# Patient Record
Sex: Male | Born: 2000 | Race: Black or African American | Hispanic: No | Marital: Single | Smoking: Never smoker
Health system: Southern US, Community
[De-identification: ages and names within clinical notes are randomized; demographics above are authoritative.]

## PROBLEM LIST (undated history)

## (undated) DIAGNOSIS — B009 Herpesviral infection, unspecified: Secondary | ICD-10-CM

## (undated) DIAGNOSIS — F649 Gender identity disorder, unspecified: Secondary | ICD-10-CM

## (undated) DIAGNOSIS — I10 Essential (primary) hypertension: Secondary | ICD-10-CM

## (undated) HISTORY — DX: Herpesviral infection, unspecified: B00.9

---
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
## 2022-01-22 ENCOUNTER — Other Ambulatory Visit (INDEPENDENT_AMBULATORY_CARE_PROVIDER_SITE_OTHER): Payer: Self-pay | Admitting: Pediatric Endocrinology

## 2022-01-22 DIAGNOSIS — E349 Endocrine disorder, unspecified: Secondary | ICD-10-CM

## 2022-01-30 ENCOUNTER — Encounter (INDEPENDENT_AMBULATORY_CARE_PROVIDER_SITE_OTHER): Payer: Self-pay | Admitting: Pediatric Endocrinology

## 2022-02-16 ENCOUNTER — Encounter (INDEPENDENT_AMBULATORY_CARE_PROVIDER_SITE_OTHER): Payer: Self-pay | Admitting: Pediatric Endocrinology

## 2022-03-06 ENCOUNTER — Encounter (INDEPENDENT_AMBULATORY_CARE_PROVIDER_SITE_OTHER): Payer: Self-pay | Admitting: Pediatric Endocrinology

## 2022-03-06 NOTE — Progress Notes (Signed)
03/06/22  Re: JamariDowtinTorain   DOB: 12-01-2000   To Whom it May Concern,  JamariDowtinTorain is a patient in my care at [facility]. Layken has been a patient here since April 2022.  They identify as male and use she/her/hers pronouns.    JamariDowtinTorain notes that she first knew that her gender identity differed from her  assigned sex as a young child. She first disclosed to others at age 21.   Carnel Vaquerano  has socially transitioned by wearing her hair long, using a male name and pronouns, tucking, and dressing as a male.  They have been successfully and consistently living in a gender role congruent with her  affirmed gender since 2019. They have been on gender affirming hormone therapy consistently since 2022.   Despite these interventions, Manford Reta  reports significant anxiety, depression and distress due to her ongoing experience of dysphoria.   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   Randol Rostron has expressed a persistent desire for gender affirming breast augmentation surgery. Their goals for surgical intervention are a more feminine appearance.  Surgery will address her gender dysphoria through helping her appear more feminine and improving the physical manifestation of her internal self image.  Tyron Vanatta is psychologically stable to undergo this surgery.  They are stably housed and she has prepared for her post-op recovery. They have no issues with illicit drug use or abuse  Jacobi Dukes has more than met the [facility] criteria for gender affirming breast augmentation.    The risks, benefits, and alternatives of this surgery have been discussed at her initial surgical consult.  Montarius Liskey is able to make an informed decision about undertaking surgery.   I believe that the next appropriate step for this patient is  for them to undergo gender affirming breast augmentation surgery and I believe this will help her make significant progress in further treating her gender dysphoria.   I am available for coordination of care and ongoing treatment as needed and welcome a phone call to establish this.Therefore, I hereby recommend and refer Darvin Aslin to have this surgery.   If you have questions or concerns, please do not hesitate to contact me.   Sincerely,    Lelon Huh, MD  [phone]

## 2022-03-25 ENCOUNTER — Encounter (INDEPENDENT_AMBULATORY_CARE_PROVIDER_SITE_OTHER): Payer: Self-pay | Admitting: Pediatric Endocrinology

## 2022-04-01 ENCOUNTER — Other Ambulatory Visit (INDEPENDENT_AMBULATORY_CARE_PROVIDER_SITE_OTHER): Payer: Self-pay | Admitting: Pediatric Endocrinology

## 2022-04-01 DIAGNOSIS — I10 Essential (primary) hypertension: Secondary | ICD-10-CM

## 2022-04-04 ENCOUNTER — Encounter: Payer: Medicaid Other | Admitting: Family Medicine

## 2022-04-04 NOTE — Progress Notes (Signed)
Pt came to clinic because they thought we prescribe PREP. Given list of providers in [place].  Please disregard this encounter

## 2022-04-05 ENCOUNTER — Ambulatory Visit: Payer: Medicaid Other | Admitting: Family Medicine

## 2022-04-27 ENCOUNTER — Other Ambulatory Visit (INDEPENDENT_AMBULATORY_CARE_PROVIDER_SITE_OTHER): Payer: Self-pay | Admitting: Pediatric Endocrinology

## 2022-04-27 DIAGNOSIS — E349 Endocrine disorder, unspecified: Secondary | ICD-10-CM

## 2022-04-27 NOTE — H&P (Signed)
Subjective:     Patient ID: Matthew Parrish is a 22 y.o. adult.   HPI   Returns for follow up discussion prior to planned gender affirming surgery. Patient has identified as male since 8 th grade (over 5 years) in all aspects of life. Started Depo, spironolactone therapy managed by Dr. Baldo Ash since 06/2020. Started estrogen treatment 10/2020. Notes breast development over this time.   PMH significant for HTN on lisinopril.   Lives with mother who accompanies her today. Currently working at [employer].   Review of Systems Remainder 12 point review negative    Objective:   Physical Exam Cardiovascular:     Rate and Rhythm: Normal rate and regular rhythm.     Heart sounds: Normal heart sounds.  Pulmonary:     Effort: Pulmonary effort is normal.     Breath sounds: Normal breath sounds.  Lymphadenopathy:     Upper Body:     Right upper body: No axillary adenopathy.     Left upper body: No axillary adenopathy.  Skin:    Comments: Fitzpatrick 6     Chest: no masses no ptosis Soft tissue pinch 6 cm SN to nipple R 20 L 20 cm Midline to nipple R 11.5 L 11. 5 cm BW R 13 L 14 cm Nipple to IMF R 6 L 6 cm      Assessment:     Gender dysphoria    Plan:     Patient has identified and lived as male for over 1 year and completed hormonal therapy for 1 years. She appears to be good candidate for augmentation and good understanding of risks of procedure.    Discussed possible incisions, dual plane vs subglandular vs subfascial positions. Explained by preference for IMF incision and subfascial placement. Discussed saline vs silicone, imaging surveillance of latter, rippling that may be more severe with saline implants, risks rupture, capsular contracture. Discussed post procedure limitations, pain, recovery.  Discussed length of surgery, possible complications of the surgery, and the post surgical limitations and alternatives of surgical approaches to augmentation. She is agreeable to  IMF incisions and subfascial placement. Patient has elected for silicone implants. Plan smooth round. We discussed at her age this is an off label use of silicone implants. Patient re sized today and liked 250 cc. Counseled cannot assure her cup size. Reviewed implants are not permanent devices and additional surgery related to them common. Reviewed the baseline asymmetry breasts, including nipple position, may be more prominent with increased volume.    Additional risks including but not limited to bleeding, seroma, hematoma, infection requiring removal, damage to adjacent structures, unacceptable cosmetic result, need for additional procedures, blood clots in legs or lungs reviewed.    Discussed with patient there is no specific consensus for breast cancer screening for transgender women. Our local radiologists recommend annual or biannual mammography beginning age 52 for transgender women who have taken estrogen treatment for least 5 years.   Completed Herma Carson physician patient checklist.    Rx for tramadol given.

## 2022-05-02 ENCOUNTER — Encounter (HOSPITAL_BASED_OUTPATIENT_CLINIC_OR_DEPARTMENT_OTHER): Payer: Self-pay | Admitting: Plastic Surgery

## 2022-05-02 ENCOUNTER — Other Ambulatory Visit: Payer: Self-pay

## 2022-05-08 ENCOUNTER — Encounter (HOSPITAL_BASED_OUTPATIENT_CLINIC_OR_DEPARTMENT_OTHER)
Admission: RE | Admit: 2022-05-08 | Discharge: 2022-05-08 | Disposition: A | Discharge status: Home / Self Care | Payer: Medicaid Other | Source: Ambulatory Visit | Attending: Plastic Surgery | Admitting: Plastic Surgery

## 2022-05-08 DIAGNOSIS — Z01812 Encounter for preprocedural laboratory examination: Secondary | ICD-10-CM | POA: Diagnosis present

## 2022-05-08 LAB — BASIC METABOLIC PANEL
Anion gap: 6 (ref 5–15)
BUN: 16 mg/dL (ref 6–20)
CO2: 25 mmol/L (ref 22–32)
Calcium: 8.6 mg/dL — ABNORMAL LOW (ref 8.9–10.3)
Chloride: 104 mmol/L (ref 98–111)
Creatinine, Ser: 0.7 mg/dL (ref 0.61–1.24)
GFR, Estimated: >60 mL/min (ref >60)
Glucose, Bld: 93 mg/dL (ref 70–99)
Potassium: 3.6 mmol/L (ref 3.5–5.1)
Sodium: 135 mmol/L (ref 135–145)

## 2022-05-08 MED ORDER — CHLORHEXIDINE GLUCONATE CLOTH 2 % EX PADS: 6.0000 | MEDICATED_PAD | Freq: Once | CUTANEOUS | Status: DC

## 2022-05-08 NOTE — Progress Notes (Signed)

## 2022-05-09 ENCOUNTER — Ambulatory Visit: Payer: Medicaid Other | Admitting: Family Medicine

## 2022-05-09 ENCOUNTER — Encounter (HOSPITAL_BASED_OUTPATIENT_CLINIC_OR_DEPARTMENT_OTHER)
Admission: RE | Disposition: A | Discharge status: Home / Self Care | Payer: Self-pay | Source: Home / Self Care | Attending: Plastic Surgery

## 2022-05-09 ENCOUNTER — Other Ambulatory Visit: Payer: Self-pay

## 2022-05-09 ENCOUNTER — Ambulatory Visit (HOSPITAL_BASED_OUTPATIENT_CLINIC_OR_DEPARTMENT_OTHER): Payer: Medicaid Other | Admitting: Certified Registered Nurse Anesthetist

## 2022-05-09 ENCOUNTER — Encounter (HOSPITAL_BASED_OUTPATIENT_CLINIC_OR_DEPARTMENT_OTHER): Payer: Self-pay | Admitting: Plastic Surgery

## 2022-05-09 ENCOUNTER — Ambulatory Visit (HOSPITAL_BASED_OUTPATIENT_CLINIC_OR_DEPARTMENT_OTHER)
Admission: RE | Admit: 2022-05-09 | Discharge: 2022-05-09 | Disposition: A | Discharge status: Home / Self Care | Payer: Medicaid Other | Attending: Plastic Surgery | Admitting: Plastic Surgery

## 2022-05-09 DIAGNOSIS — F649 Gender identity disorder, unspecified: Secondary | ICD-10-CM | POA: Diagnosis not present

## 2022-05-09 DIAGNOSIS — F640 Transsexualism: Secondary | ICD-10-CM | POA: Insufficient documentation

## 2022-05-09 DIAGNOSIS — Z79899 Other long term (current) drug therapy: Secondary | ICD-10-CM | POA: Insufficient documentation

## 2022-05-09 DIAGNOSIS — I10 Essential (primary) hypertension: Secondary | ICD-10-CM | POA: Diagnosis not present

## 2022-05-09 DIAGNOSIS — E119 Type 2 diabetes mellitus without complications: Secondary | ICD-10-CM | POA: Diagnosis not present

## 2022-05-09 DIAGNOSIS — Z794 Long term (current) use of insulin: Secondary | ICD-10-CM | POA: Diagnosis not present

## 2022-05-09 HISTORY — PX: BREAST ENHANCEMENT SURGERY: SHX7

## 2022-05-09 HISTORY — DX: Essential (primary) hypertension: I10

## 2022-05-09 HISTORY — DX: Gender identity disorder, unspecified: F64.9

## 2022-05-09 SURGERY — AUGMENTATION, BREAST
Anesthesia: General | Site: Chest | Laterality: Bilateral

## 2022-05-09 MED ORDER — MIDAZOLAM HCL 5 MG/5ML IJ SOLN
INTRAMUSCULAR | Status: DC | PRN
  Administered 2022-05-09: 2 mg via INTRAVENOUS

## 2022-05-09 MED ORDER — DEXAMETHASONE SODIUM PHOSPHATE 10 MG/ML IJ SOLN
INTRAMUSCULAR | Status: AC
  Filled 2022-05-09: qty 1

## 2022-05-09 MED ORDER — OXYCODONE HCL 5 MG PO TABS: 5.0000 mg | ORAL_TABLET | Freq: Once | ORAL | Status: DC | PRN

## 2022-05-09 MED ORDER — HYDROMORPHONE HCL 1 MG/ML IJ SOLN
INTRAMUSCULAR | Status: AC
  Filled 2022-05-09: qty 0.5

## 2022-05-09 MED ORDER — LIDOCAINE HCL (CARDIAC) PF 100 MG/5ML IV SOSY
PREFILLED_SYRINGE | INTRAVENOUS | Status: DC | PRN
  Administered 2022-05-09: 50 mg via INTRAVENOUS

## 2022-05-09 MED ORDER — MEPERIDINE HCL 25 MG/ML IJ SOLN: 6.2500 mg | INTRAMUSCULAR | Status: DC | PRN

## 2022-05-09 MED ORDER — SULFAMETHOXAZOLE-TRIMETHOPRIM 800-160 MG PO TABS: 1.0000 | ORAL_TABLET | Freq: Two times a day (BID) | ORAL | 0 refills | Status: DC

## 2022-05-09 MED ORDER — SUGAMMADEX SODIUM 200 MG/2ML IV SOLN
INTRAVENOUS | Status: DC | PRN
  Administered 2022-05-09: 200 mg via INTRAVENOUS

## 2022-05-09 MED ORDER — 0.9 % SODIUM CHLORIDE (POUR BTL) OPTIME
TOPICAL | Status: DC | PRN
  Administered 2022-05-09: 1000 mL

## 2022-05-09 MED ORDER — ACETAMINOPHEN 500 MG PO TABS
ORAL_TABLET | ORAL | Status: AC
  Filled 2022-05-09: qty 2

## 2022-05-09 MED ORDER — HYDROMORPHONE HCL 1 MG/ML IJ SOLN
0.2500 mg | INTRAMUSCULAR | Status: DC | PRN
  Administered 2022-05-09: 0.5 mg via INTRAVENOUS

## 2022-05-09 MED ORDER — LIDOCAINE 2% (20 MG/ML) 5 ML SYRINGE
INTRAMUSCULAR | Status: AC
  Filled 2022-05-09: qty 5

## 2022-05-09 MED ORDER — SODIUM CHLORIDE 0.9 % IV SOLN
INTRAVENOUS | Status: AC
  Filled 2022-05-09: qty 10

## 2022-05-09 MED ORDER — DEXMEDETOMIDINE HCL IN NACL 80 MCG/20ML IV SOLN
INTRAVENOUS | Status: DC | PRN
  Administered 2022-05-09: 8 ug via BUCCAL

## 2022-05-09 MED ORDER — FENTANYL CITRATE (PF) 100 MCG/2ML IJ SOLN
INTRAMUSCULAR | Status: AC
  Filled 2022-05-09: qty 2

## 2022-05-09 MED ORDER — BUPIVACAINE HCL (PF) 0.5 % IJ SOLN
INTRAMUSCULAR | Status: DC | PRN
  Administered 2022-05-09: 30 mL

## 2022-05-09 MED ORDER — OXYCODONE HCL 5 MG/5ML PO SOLN: 5.0000 mg | Freq: Once | ORAL | Status: DC | PRN

## 2022-05-09 MED ORDER — ONDANSETRON HCL 4 MG/2ML IJ SOLN
INTRAMUSCULAR | Status: AC
  Filled 2022-05-09: qty 2

## 2022-05-09 MED ORDER — LACTATED RINGERS IV SOLN: INTRAVENOUS | Status: DC

## 2022-05-09 MED ORDER — CEFAZOLIN SODIUM-DEXTROSE 2-4 GM/100ML-% IV SOLN
INTRAVENOUS | Status: AC
  Filled 2022-05-09: qty 100

## 2022-05-09 MED ORDER — SODIUM CHLORIDE 0.9 % IV SOLN
INTRAVENOUS | Status: DC | PRN
  Administered 2022-05-09: 600 mL

## 2022-05-09 MED ORDER — MIDAZOLAM HCL 2 MG/2ML IJ SOLN
INTRAMUSCULAR | Status: AC
  Filled 2022-05-09: qty 2

## 2022-05-09 MED ORDER — PROMETHAZINE HCL 25 MG/ML IJ SOLN: 6.2500 mg | INTRAMUSCULAR | Status: DC | PRN

## 2022-05-09 MED ORDER — GABAPENTIN 300 MG PO CAPS
300.0000 mg | ORAL_CAPSULE | ORAL | Status: AC
  Administered 2022-05-09: 300 mg via ORAL

## 2022-05-09 MED ORDER — PROPOFOL 10 MG/ML IV BOLUS
INTRAVENOUS | Status: AC
  Filled 2022-05-09: qty 20

## 2022-05-09 MED ORDER — ROCURONIUM BROMIDE 100 MG/10ML IV SOLN
INTRAVENOUS | Status: DC | PRN
  Administered 2022-05-09: 70 mg via INTRAVENOUS

## 2022-05-09 MED ORDER — CELECOXIB 200 MG PO CAPS
200.0000 mg | ORAL_CAPSULE | ORAL | Status: AC
  Administered 2022-05-09: 200 mg via ORAL

## 2022-05-09 MED ORDER — DEXMEDETOMIDINE HCL IN NACL 80 MCG/20ML IV SOLN
INTRAVENOUS | Status: AC
  Filled 2022-05-09: qty 20

## 2022-05-09 MED ORDER — GABAPENTIN 300 MG PO CAPS
ORAL_CAPSULE | ORAL | Status: AC
  Filled 2022-05-09: qty 1

## 2022-05-09 MED ORDER — PROPOFOL 10 MG/ML IV BOLUS
INTRAVENOUS | Status: DC | PRN
  Administered 2022-05-09: 200 mg via INTRAVENOUS

## 2022-05-09 MED ORDER — ACETAMINOPHEN 500 MG PO TABS
1000.0000 mg | ORAL_TABLET | ORAL | Status: AC
  Administered 2022-05-09: 1000 mg via ORAL

## 2022-05-09 MED ORDER — FENTANYL CITRATE (PF) 100 MCG/2ML IJ SOLN
INTRAMUSCULAR | Status: DC | PRN
  Administered 2022-05-09: 100 ug via INTRAVENOUS

## 2022-05-09 MED ORDER — AMISULPRIDE (ANTIEMETIC) 5 MG/2ML IV SOLN
INTRAVENOUS | Status: AC
  Filled 2022-05-09: qty 4

## 2022-05-09 MED ORDER — ONDANSETRON HCL 4 MG/2ML IJ SOLN
INTRAMUSCULAR | Status: DC | PRN
  Administered 2022-05-09: 4 mg via INTRAVENOUS

## 2022-05-09 MED ORDER — CEFAZOLIN SODIUM-DEXTROSE 2-4 GM/100ML-% IV SOLN
2.0000 g | INTRAVENOUS | Status: AC
  Administered 2022-05-09: 2 g via INTRAVENOUS

## 2022-05-09 MED ORDER — DEXAMETHASONE SODIUM PHOSPHATE 4 MG/ML IJ SOLN
INTRAMUSCULAR | Status: DC | PRN
  Administered 2022-05-09: 10 mg via INTRAVENOUS

## 2022-05-09 MED ORDER — AMISULPRIDE (ANTIEMETIC) 5 MG/2ML IV SOLN
10.0000 mg | Freq: Once | INTRAVENOUS | Status: AC | PRN
  Administered 2022-05-09: 10 mg via INTRAVENOUS

## 2022-05-09 MED ORDER — CELECOXIB 200 MG PO CAPS
ORAL_CAPSULE | ORAL | Status: AC
  Filled 2022-05-09: qty 1

## 2022-05-09 SURGICAL SUPPLY — 73 items
ADH SKN CLS APL DERMABOND .7 (GAUZE/BANDAGES/DRESSINGS) ×2
APL PRP STRL LF DISP 70% ISPRP (MISCELLANEOUS) ×2
BAG DECANTER FOR FLEXI CONT (MISCELLANEOUS) ×1 IMPLANT
BINDER BREAST LRG (GAUZE/BANDAGES/DRESSINGS) IMPLANT
BINDER BREAST MEDIUM (GAUZE/BANDAGES/DRESSINGS) IMPLANT
BINDER BREAST XLRG (GAUZE/BANDAGES/DRESSINGS) IMPLANT
BINDER BREAST XXLRG (GAUZE/BANDAGES/DRESSINGS) IMPLANT
BLADE SURG 15 STRL LF DISP TIS (BLADE) ×1 IMPLANT
BLADE SURG 15 STRL SS (BLADE)
BNDG CMPR 5X62 HK CLSR LF (GAUZE/BANDAGES/DRESSINGS)
BNDG CMPR 6"X 5 YARDS HK CLSR (GAUZE/BANDAGES/DRESSINGS)
BNDG ELASTIC 6INX 5YD STR LF (GAUZE/BANDAGES/DRESSINGS) IMPLANT
BNDG GAUZE DERMACEA FLUFF 4 (GAUZE/BANDAGES/DRESSINGS) ×2 IMPLANT
BNDG GZE DERMACEA 4 6PLY (GAUZE/BANDAGES/DRESSINGS) ×2
CANISTER SUCT 1200ML W/VALVE (MISCELLANEOUS) ×1 IMPLANT
CHLORAPREP W/TINT 26 (MISCELLANEOUS) ×1 IMPLANT
COVER BACK TABLE 60X90IN (DRAPES) ×1 IMPLANT
COVER MAYO STAND STRL (DRAPES) ×1 IMPLANT
DERMABOND ADVANCED .7 DNX12 (GAUZE/BANDAGES/DRESSINGS) ×1 IMPLANT
DRAIN CHANNEL 15F RND FF W/TCR (WOUND CARE) IMPLANT
DRAPE TOP ARMCOVERS (MISCELLANEOUS) ×1 IMPLANT
DRAPE U-SHAPE 76X120 STRL (DRAPES) ×1 IMPLANT
DRAPE UTILITY XL STRL (DRAPES) ×1 IMPLANT
DRSG TEGADERM 2-3/8X2-3/4 SM (GAUZE/BANDAGES/DRESSINGS) IMPLANT
ELECT BLADE 4.0 EZ CLEAN MEGAD (MISCELLANEOUS)
ELECT BLADE 6.5 EXT (BLADE) IMPLANT
ELECT COATED BLADE 2.86 ST (ELECTRODE) ×1 IMPLANT
ELECT REM PT RETURN 9FT ADLT (ELECTROSURGICAL) ×1
ELECTRODE BLDE 4.0 EZ CLN MEGD (MISCELLANEOUS) ×1 IMPLANT
ELECTRODE REM PT RTRN 9FT ADLT (ELECTROSURGICAL) ×1 IMPLANT
EVACUATOR SILICONE 100CC (DRAIN) IMPLANT
FUNNEL KELLER 2 DISP (MISCELLANEOUS) IMPLANT
GAUZE PAD ABD 8X10 STRL (GAUZE/BANDAGES/DRESSINGS) ×1 IMPLANT
GLOVE BIO SURGEON STRL SZ 6 (GLOVE) ×2 IMPLANT
GOWN STRL REUS W/ TWL LRG LVL3 (GOWN DISPOSABLE) ×2 IMPLANT
GOWN STRL REUS W/TWL LRG LVL3 (GOWN DISPOSABLE) ×2
IMPL BREAST GEL 295 (Breast) IMPLANT
IMPLANT BREAST GEL 295 (Breast) ×2 IMPLANT
IV NS 1000ML (IV SOLUTION)
IV NS 1000ML BAXH (IV SOLUTION) IMPLANT
IV NS 500ML (IV SOLUTION)
IV NS 500ML BAXH (IV SOLUTION) ×1 IMPLANT
KIT FILL ASEPTIC TRANSFER (MISCELLANEOUS) IMPLANT
MARKER SKIN DUAL TIP RULER LAB (MISCELLANEOUS) IMPLANT
NDL HYPO 25X1 1.5 SAFETY (NEEDLE) ×1 IMPLANT
NEEDLE HYPO 25X1 1.5 SAFETY (NEEDLE) ×1 IMPLANT
NS IRRIG 1000ML POUR BTL (IV SOLUTION) ×1 IMPLANT
PACK BASIN DAY SURGERY FS (CUSTOM PROCEDURE TRAY) ×1 IMPLANT
PENCIL SMOKE EVACUATOR (MISCELLANEOUS) ×1 IMPLANT
PIN SAFETY STERILE (MISCELLANEOUS) IMPLANT
SHEET MEDIUM DRAPE 40X70 STRL (DRAPES) IMPLANT
SLEEVE SCD COMPRESS KNEE MED (STOCKING) ×1 IMPLANT
SPIKE FLUID TRANSFER (MISCELLANEOUS) ×1 IMPLANT
SPONGE T-LAP 18X18 ~~LOC~~+RFID (SPONGE) ×1 IMPLANT
STAPLER VISISTAT 35W (STAPLE) ×1 IMPLANT
STRIP CLOSURE SKIN 1/2X4 (GAUZE/BANDAGES/DRESSINGS) ×1 IMPLANT
SUT ETHILON 2 0 FS 18 (SUTURE) IMPLANT
SUT MNCRL AB 4-0 PS2 18 (SUTURE) ×1 IMPLANT
SUT PDS AB 2-0 CT2 27 (SUTURE) IMPLANT
SUT VIC AB 3-0 PS1 18 (SUTURE)
SUT VIC AB 3-0 PS1 18XBRD (SUTURE) IMPLANT
SUT VIC AB 3-0 SH 27 (SUTURE) ×1
SUT VIC AB 3-0 SH 27X BRD (SUTURE) ×1 IMPLANT
SUT VICRYL 0 CT-2 (SUTURE) IMPLANT
SUT VICRYL 4-0 PS2 18IN ABS (SUTURE) ×1 IMPLANT
SUT VLOC 180 0 24IN GS25 (SUTURE) IMPLANT
SYR 50ML LL SCALE MARK (SYRINGE) ×1 IMPLANT
SYR BULB IRRIG 60ML STRL (SYRINGE) ×1 IMPLANT
SYR CONTROL 10ML LL (SYRINGE) ×1 IMPLANT
TOWEL GREEN STERILE FF (TOWEL DISPOSABLE) ×2 IMPLANT
TUBE CONNECTING 20X1/4 (TUBING) ×1 IMPLANT
UNDERPAD 30X36 HEAVY ABSORB (UNDERPADS AND DIAPERS) ×1 IMPLANT
YANKAUER SUCT BULB TIP NO VENT (SUCTIONS) ×1 IMPLANT

## 2022-05-09 NOTE — Interval H&P Note (Signed)
History and Physical Interval Note:  05/09/2022 10:47 AM  Matthew Parrish  has presented today for surgery, with the diagnosis of Gender dysphoria.  The various methods of treatment have been discussed with the patient and family. After consideration of risks, benefits and other options for treatment, the patient has consented to  Procedure(s): MAMMOPLASTY AUGMENTATION (BREAST) (Bilateral) as a surgical intervention.  The patient's history has been reviewed, patient examined, no change in status, stable for surgery.  I have reviewed the patient's chart and labs.  Questions were answered to the patient's satisfaction.     Arnoldo Hooker Shanley Furlough

## 2022-05-09 NOTE — Transfer of Care (Signed)
Immediate Anesthesia Transfer of Care Note  Patient: Matthew Parrish  Procedure(s) Performed: MAMMOPLASTY AUGMENTATION (BREAST) (Bilateral: Chest)  Patient Location: PACU  Anesthesia Type:General  Level of Consciousness: awake, alert , and oriented  Airway & Oxygen Therapy: Patient Spontanous Breathing and Patient connected to face mask oxygen  Post-op Assessment: Report given to RN and Post -op Vital signs reviewed and stable  Post vital signs: Reviewed and stable  Last Vitals:  Vitals Value Taken Time  BP 107/72 05/09/22 1304  Temp    Pulse 82 05/09/22 1307  Resp 19 05/09/22 1307  SpO2 100 % 05/09/22 1307  Vitals shown include unvalidated device data.  Last Pain:  Vitals:   05/09/22 0929  TempSrc: Oral  PainSc: 0-No pain      Patients Stated Pain Goal: 4 ([id number])  Complications: No notable events documented.

## 2022-05-09 NOTE — Anesthesia Procedure Notes (Signed)
Procedure Name: Intubation Date/Time: 05/09/2022 11:31 AM  Performed by: Bufford Spikes, CRNAPre-anesthesia Checklist: Patient identified, Emergency Drugs available, Suction available and Patient being monitored Patient Re-evaluated:Patient Re-evaluated prior to induction Oxygen Delivery Method: Circle system utilized Preoxygenation: Pre-oxygenation with 100% oxygen Induction Type: IV induction Ventilation: Mask ventilation without difficulty Laryngoscope Size: Miller and 2 Grade View: Grade I Tube type: Oral Tube size: 7.5 mm Number of attempts: 1 Airway Equipment and Method: Stylet Placement Confirmation: ETT inserted through vocal cords under direct vision, positive ETCO2 and breath sounds checked- equal and bilateral Secured at: 20 cm Tube secured with: Tape Dental Injury: Teeth and Oropharynx as per pre-operative assessment

## 2022-05-09 NOTE — Op Note (Addendum)
Operative Note   DATE OF OPERATION: 2.27.2024  LOCATION: [facility]-outpatient  SURGICAL DIVISION: Plastic Surgery  PREOPERATIVE DIAGNOSES:  Gender dysphoria  POSTOPERATIVE DIAGNOSES:  same  PROCEDURE:  Bilateral breast augmentation with silicone implants  SURGEON: Irene Limbo MD MBA  ASSISTANT: none  ANESTHESIA:  General.   EBL: 50 ml  COMPLICATIONS: None immediate.   INDICATIONS FOR PROCEDURE:  The patient, Matthew Parrish, is a 22 y.o. adult born on 2000-05-12, is here for gender affirming chest surgery.   FINDINGS: Natrelle Soft Touch smooth round Full Projection 295 ml implants placed bilateral. REF [id number] RIGHT SN [id number]  DESCRIPTION OF PROCEDURE:  The patient's operative site was marked with the patient in the preoperative area. The patient was taken to the operating room. SCDs were placed and IV antibiotics were given. The patient's operative site was prepped and draped in a sterile fashion. A time out was performed and all information was confirmed to be correct. Tegaderms placed over bilateral nipple areola complex. Incision designed at desired inframammary fold 8 cm from nipple. I began on left breast. Incision carried through superficial fascia to pectoralis surface. Pectoralis fascia elevated with overlying breast tissue. Dissection completed to anticipated dimensions implant. Scoring of fascia and breast tissue completed over lower pole. Interrupted 2-0 PDS suture placed from superficial fascia of caudal incision to chest wall to stabilize IMF.    I then directed attention to right breast. Incision made at new IMF and carried through superficial fascia to pectoralis surface. Pectoralis fascia elevated with overlying breast tissue. Dissection completed to anticipated dimensions implant. Interrupted 2-0 PDS suture placed from superficial fascia of caudal incision to chest wall to stabilize IMF. Scoring of fascia and breast tissue completed  over lower pole. Natrelle Full Profile 295 ml implants were planned with patient preoperatively.    Bilateral breast cavities irrigated with saline solution containing Ancef, gentamicin, and betadine. Hemostasis ensured. Local anesthetic infiltrated throughout bilateral breasts. Implant placed in right breast cavity with aid Keller funnel. Orientation implant ensured. Closure completed with 3-0 vicryl in superficial fascia, 4-0 vicryl in dermis and running 4-0 monocryl subcuticular closure.    Over left breast, implant placed in cavity with Keller funnel. Orientation implant ensured. Closure completed in similar fashion. Dermabond applied followed by dry dressing and breast binder.  The patient was allowed to wake from anesthesia, extubated and taken to the recovery room in satisfactory condition.   SPECIMENS: none  DRAINS: none  Irene Limbo, MD [facility]  Office/ physician access line after hours [phone]

## 2022-05-09 NOTE — Anesthesia Postprocedure Evaluation (Signed)
Anesthesia Post Note  Patient: Matthew Parrish  Procedure(s) Performed: MAMMOPLASTY AUGMENTATION (BREAST) (Bilateral: Chest)     Patient location during evaluation: PACU Anesthesia Type: General Level of consciousness: awake and alert Pain management: pain level controlled Vital Signs Assessment: post-procedure vital signs reviewed and stable Respiratory status: spontaneous breathing, nonlabored ventilation, respiratory function stable and patient connected to nasal cannula oxygen Cardiovascular status: blood pressure returned to baseline and stable Postop Assessment: no apparent nausea or vomiting Anesthetic complications: no  No notable events documented.  Last Vitals:  Vitals:   05/09/22 1345 05/09/22 1408  BP: 107/82 115/80  Pulse: 76 62  Resp: 13 18  Temp:  (!) 36.2 C  SpO2: 100% 98%    Last Pain:  Vitals:   05/09/22 1408  TempSrc: Oral  PainSc: 7                  Effie Berkshire

## 2022-05-09 NOTE — Discharge Instructions (Signed)
  Post Anesthesia Home Care Instructions  Activity: Get plenty of rest for the remainder of the day. A responsible individual must stay with you for 24 hours following the procedure.  For the next 24 hours, DO NOT: -Drive a car -Paediatric nurse -Drink alcoholic beverages -Take any medication unless instructed by your physician -Make any legal decisions or sign important papers.  Meals: Start with liquid foods such as gelatin or soup. Progress to regular foods as tolerated. Avoid greasy, spicy, heavy foods. If nausea and/or vomiting occur, drink only clear liquids until the nausea and/or vomiting subsides. Call your physician if vomiting continues.  Special Instructions/Symptoms: Your throat may feel dry or sore from the anesthesia or the breathing tube placed in your throat during surgery. If this causes discomfort, gargle with warm salt water. The discomfort should disappear within 24 hours.  If you had a scopolamine patch placed behind your ear for the management of post- operative nausea and/or vomiting:  1. The medication in the patch is effective for 72 hours, after which it should be removed.  Wrap patch in a tissue and discard in the trash. Wash hands thoroughly with soap and water. 2. You may remove the patch earlier than 72 hours if you experience unpleasant side effects which may include dry mouth, dizziness or visual disturbances. 3. Avoid touching the patch. Wash your hands with soap and water after contact with the patch.  No Tylenol and Ibuprofen until after today 3:31PM today

## 2022-05-09 NOTE — Anesthesia Preprocedure Evaluation (Addendum)
Anesthesia Evaluation  Patient identified by MRN, date of birth, ID band Patient awake    Reviewed: Allergy & Precautions, NPO status , Patient's Chart, lab work & pertinent test results, Unable to perform ROS - Chart review only  Airway Mallampati: I  TM Distance: >3 FB Neck ROM: Full    Dental  (+) Teeth Intact, Dental Advisory Given   Pulmonary neg pulmonary ROS   Pulmonary exam normal breath sounds clear to auscultation       Cardiovascular hypertension, Pt. on medications  Rhythm:Regular Rate:Normal     Neuro/Psych  PSYCHIATRIC DISORDERS      negative neurological ROS     GI/Hepatic negative GI ROS, Neg liver ROS,,,  Endo/Other  diabetes, Insulin Dependent    Renal/GU negative Renal ROS     Musculoskeletal negative musculoskeletal ROS (+)    Abdominal   Peds  Hematology negative hematology ROS (+)   Anesthesia Other Findings   Reproductive/Obstetrics                             Anesthesia Physical Anesthesia Plan  ASA: 2  Anesthesia Plan: General   Post-op Pain Management: Tylenol PO (pre-op)* and Gabapentin PO (pre-op)*   Induction: Intravenous  PONV Risk Score and Plan: 3 and Ondansetron, Dexamethasone, Treatment may vary due to age or medical condition and Midazolam  Airway Management Planned: Oral ETT  Additional Equipment: None  Intra-op Plan:   Post-operative Plan: Extubation in OR  Informed Consent: I have reviewed the patients History and Physical, chart, labs and discussed the procedure including the risks, benefits and alternatives for the proposed anesthesia with the patient or authorized representative who has indicated his/her understanding and acceptance.     Dental advisory given  Plan Discussed with:   Anesthesia Plan Comments:         Anesthesia Quick Evaluation

## 2022-05-10 ENCOUNTER — Encounter (HOSPITAL_BASED_OUTPATIENT_CLINIC_OR_DEPARTMENT_OTHER): Payer: Self-pay | Admitting: Plastic Surgery

## 2022-06-26 ENCOUNTER — Encounter (INDEPENDENT_AMBULATORY_CARE_PROVIDER_SITE_OTHER): Payer: Self-pay | Admitting: Pediatric Endocrinology

## 2022-06-26 ENCOUNTER — Ambulatory Visit (INDEPENDENT_AMBULATORY_CARE_PROVIDER_SITE_OTHER): Payer: Medicaid Other | Admitting: Pediatric Endocrinology

## 2022-06-26 VITALS — BP 120/72 | HR 76 | Wt 154.4 lb

## 2022-06-26 DIAGNOSIS — F649 Gender identity disorder, unspecified: Secondary | ICD-10-CM | POA: Diagnosis not present

## 2022-06-26 DIAGNOSIS — I10 Essential (primary) hypertension: Secondary | ICD-10-CM | POA: Diagnosis not present

## 2022-06-26 DIAGNOSIS — E349 Endocrine disorder, unspecified: Secondary | ICD-10-CM

## 2022-06-26 DIAGNOSIS — Z79899 Other long term (current) drug therapy: Secondary | ICD-10-CM

## 2022-06-26 MED ORDER — MEDROXYPROGESTERONE ACETATE 150 MG/ML IM SUSP
150.0000 mg | Freq: Once | INTRAMUSCULAR | Status: AC
  Administered 2022-06-26: 150 mg via INTRAMUSCULAR

## 2022-06-26 MED ORDER — ESTRADIOL VALERATE 40 MG/ML IM OIL: TOPICAL_OIL | INTRAMUSCULAR | 0 refills | Status: DC

## 2022-06-26 NOTE — Progress Notes (Addendum)
Name of Medication: MedroxyProgesterone Acetate    NDC number: 16109-604-54   Lot Number: [id number]     Expiration Date:  06/09/2024  Who supplied the medication? Patient supplied    Who administered the injection? Pollie Friar, CMA, AAMA   Administration Site:  Right Deltoid    Patient supplied: Yes     Was the patient observed for 10-15 minutes after injection was given? Yes  If not, why?      Was there an adverse reaction after giving medication? No If yes, what reaction?      I have reviewed the following documentation and I am in agreement.  I was immediately available to the medical assistant for questions and collaboration.  Dessa Phi, MD

## 2022-06-26 NOTE — Progress Notes (Signed)
Subjective:  Subjective  Patient Name: Matthew Parrish Date of Birth: Feb 12, 2001  MRN: [id number]  Matthew Parrish  presents to the office today for follow up evaluation and management of her gender dysphoria  HISTORY OF PRESENT ILLNESS:   Matthew Parrish is a 22 y.o. AA transfemale   Matthew Parrish was un-accompanied today  1. Matthew Parrish was seen by her PCP in April 2022 to ask for a referral for gender affirming care. She was referred to our clinic by her PCP at age 63 years.    2. Matthew Parrish was last seen in pediatric endocrine clinic on 12/22/21. In the interim she has been doing ok.   She had breast augmentation surgery done in February 2024. She is very pleased with the results.   She did not come for her 3 month Depo Provera in January. She did bring her dose with her today.   After her last visit we switched her estrogen from oral to injected. She says that she likes the injected form.  She likes that it is once a week instead of a daily pill to remember. She is giving her injections on Monday. She has not yet had it today. She needs a refill.   She is taking 5 mg of estradiol valerate (0.13 ml) weekly  She has been having morning arousals since last visit since she missed her depo provera.   She is also taking spironolactone. She is drinking a lot of water and has not had any issues with being lightheaded or dizzy.   She is not currently talking with Matthew Parrish for therapy. She says that she needs to call and make an appointment.   She is also taking her Lisinopril 5 mg daily.   --------------------------- Previous History  born at term. No issues with pregnancy or delivery. She has an older brother and sister. As a young child mom recalls that she always wanted to play with dolls. She wanted to dress up in her sister's clothing. She feels that she has always presented more male than male.   Mom says that Matthew Parrish first came out to her about 3 years ago. She was not surprised at the  time.   Matthew Parrish disclosed to her small group of friends about 2 months ago. She says that they were not surprised.   She prefers to use the girls the bathroom. She says she used to feel judged when she used the women's room- but since January she has not been using the men's room.   She has always kept her hair long since about age 60.   She has a full mani-pedi in clinic today.   Per mom, mom has full medical decision making power. She says that dad is local but does not have a good relationship with Matthew Parrish and is not supportive of her transition.    3. Pertinent Review of Systems:  Constitutional: The patient feels "good". The patient seems healthy and active. Eyes: Vision seems to be good. There are no recognized eye problems. Neck: The patient has no complaints of anterior neck swelling, soreness, tenderness, pressure, discomfort, or difficulty swallowing.   Lungs: No asthma or wheezing.  Heart: Heart rate increases with exercise or other physical activity. The patient has no complaints of palpitations, irregular heart beats, chest pain, or chest pressure.   Gastrointestinal: Bowel movents seem normal. The patient has no complaints of excessive hunger, acid reflux, upset stomach, stomach aches or pains, diarrhea, or constipation.  Legs: Muscle mass and strength seem normal. There  are no complaints of numbness, tingling, burning, or pain. No edema is noted.  Feet: There are no obvious foot problems. There are no complaints of numbness, tingling, burning, or pain. No edema is noted. Neurologic: There are no recognized problems with muscle movement and strength, sensation, or coordination. GYN/GU: per HPI  PAST MEDICAL, FAMILY, AND SOCIAL HISTORY  Past Medical History:  Diagnosis Date   Gender dysphoria    Hypertension     Family History  Problem Relation Age of Onset   Eczema Sister    Hypertension Maternal Grandmother    Hypertension Maternal Grandfather    Glaucoma Maternal  Grandfather    Diabetes type II Maternal Grandfather    Hypertension Paternal Grandfather      Current Outpatient Medications:    Cholecalciferol (VITAMIN D) 125 MCG (5000 UT) CAPS, Take by mouth., Disp: , Rfl:    estradiol valerate (DELESTROGEN) 40 MG/ML injection, Use an insulin syringe and draw up 13 units (5mg ). Inject subcutaneous. Once every 7 days., Disp: 5 mL, Rfl: 0   Insulin Syringe-Needle U-100 (INSULIN SYRINGE .5CC/28G) 28G X 1/2" 0.5 ML MISC, Use as directed with estradiol, Disp: 10 each, Rfl: 5   lisinopril (ZESTRIL) 5 MG tablet, TAKE 1 TABLET (5 MG TOTAL) BY MOUTH DAILY., Disp: 90 tablet, Rfl: 3   medroxyPROGESTERone (DEPO-PROVERA) 150 MG/ML injection, Inject 1 mL (150 mg total) into the muscle every 3 (three) months., Disp: 1 mL, Rfl: 1   spironolactone (ALDACTONE) 50 MG tablet, TAKE 1 TABLET BY MOUTH EVERY DAY, Disp: 90 tablet, Rfl: 3   sulfamethoxazole-trimethoprim (BACTRIM DS) 800-160 MG tablet, Take 1 tablet by mouth 2 (two) times daily. (Patient not taking: Reported on 06/26/2022), Disp: 14 tablet, Rfl: 0  Allergies as of 06/26/2022   (No Known Allergies)     reports that she has never smoked. She has been exposed to tobacco smoke. She has never used smokeless tobacco. She reports that she does not drink alcohol and does not use drugs. Pediatric History  Patient Parents   Matthew Parrish (Mother)   Other Topics Concern   Not on file  Social History Narrative   Lives with mom, sister, brother, grandma.       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 School and Family:   Lives with mom, GM, siblings 2. Activities: wants to be a hair dresser - starting school next year??. She is working at [employer] 3. Primary Care Provider: Erasmo Downer, MD  ROS: There are no other significant problems involving Matthew Parrish's other body systems.    Objective:  Objective  Vital Signs:    BP 120/72 (BP Location: Right Arm, Patient Position: Sitting,  Cuff Size: Large)   Pulse 76   Wt 154 lb 6.4 oz (70 kg)   BMI 21.53 kg/m     Ht Readings from Last 3 Encounters:  05/09/22 5\' 11"  (1.803 m) (>99 %, Z= 2.65)*  09/21/21 5' 11.02" (1.804 m) (>99 %, Z= 2.66)*  06/02/21 5' 11.65" (1.82 m) (>99 %, Z= 2.92)*   * Growth percentiles are based on CDC (Girls, 2-20 Years) data.   Wt Readings from Last 3 Encounters:  06/26/22 154 lb 6.4 oz (70 kg) (84 %, Z= 1.00)*  05/09/22 152 lb 1.9 oz (69 kg) (83 %, Z= 0.94)*  12/22/21 143 lb (64.9 kg) (75 %, Z= 0.69)*   * Growth percentiles are based on CDC (Girls, 2-20 Years) data.   HC Readings from Last  3 Encounters:  No data found for [facility]   Body surface area is 1.87 meters squared. No height on file for this encounter. 84 %ile (Z= 1.00) based on CDC (Girls, 2-20 Years) weight-for-age data using vitals from 06/26/2022.   PHYSICAL EXAM:     Constitutional: The patient appears healthy and well nourished. The patient's height and weight are normal for age.  Weight is essentially stable  Head: The head is normocephalic. Face: The face appears normal. There are no obvious dysmorphic features. Eyes: The eyes appear to be normally formed and spaced. Gaze is conjugate. There is no obvious arcus or proptosis. Moisture appears normal. Ears: The ears are normally placed and appear externally normal. Mouth: The oropharynx and tongue appear normal. Dentition appears to be normal for age. Oral moisture is normal. Neck: The neck appears to be visibly normal.  The consistency of the thyroid gland is normal. The thyroid gland is not tender to palpation. Lungs: The lungs are clear to auscultation. Air movement is good. Heart: Heart rate and rhythm are regular. Heart sounds S1 and S2 are normal. I did not appreciate any pathologic cardiac murmurs. Abdomen: The abdomen appears to be normal in size for the patient's age. Bowel sounds are normal. There is no obvious hepatomegaly, splenomegaly, or other mass effect.   Arms: Muscle size and bulk are normal for age. Hands: There is no obvious tremor. Phalangeal and metacarpophalangeal joints are normal. Palmar muscles are normal for age. Palmar skin is normal. Palmar moisture is also normal. Legs: Muscles appear normal for age. No edema is present. Feet: Feet are normally formed. Dorsalis pedal pulses are normal. Neurologic: Strength is normal for age in both the upper and lower extremities. Muscle tone is normal. Sensation to touch is normal in both the legs and feet.   GYN/GU: Breasts Tanner 3- firm and tender BL  Puberty: Fully pubertal. Tucking. No exam done.   LAB DATA:    No results found for this or any previous visit (from the past 672 hour(s)).    Assessment and Plan:  Assessment  ASSESSMENT: Kiyoto is a 22 y.o. transfemale who presents for continuation of gender affirming hormone therapy.    Gender Dysphoria - complete social transition - She is due for  Depot Provera in clinic today for suppression of LH/HPG axis - Spironolactone 50 mg daily as anti androgen.  - Estradiol 5 mg weekly (injected) - Estradiol Valerate Refilled.    Hypertension - She has had elevated BP at multiple visits - On Spironolactone - On Lisinopril 5 mg daily - Monitor Potassium moving forward - BP is stable today    Lab Orders  No laboratory test(s) ordered today    Discussion of the above.  Continue Calcium 1000 mg daily Continue Vit D 2000 IU daily  Estradiol valerate 5 mg weekly Depot Provera 150mg  due today.   Follow-up: Return in about 3 months (around 09/24/2022).      Dessa Phi, MD   LOS  >40 minutes spent today reviewing the medical chart, counseling the patient/family, and documenting today's encounter.      Patient referred by Erasmo Downer, MD for  Gender dysphoria   Copy of this note sent to Erasmo Downer, MD.

## 2022-06-27 ENCOUNTER — Encounter (INDEPENDENT_AMBULATORY_CARE_PROVIDER_SITE_OTHER): Payer: Self-pay | Admitting: Pediatric Endocrinology

## 2022-06-30 ENCOUNTER — Other Ambulatory Visit (INDEPENDENT_AMBULATORY_CARE_PROVIDER_SITE_OTHER): Payer: Self-pay | Admitting: Pediatric Endocrinology

## 2022-07-02 LAB — COMPREHENSIVE METABOLIC PANEL
AG Ratio: 1.3 (calc) (ref 1.0–2.5)
ALT: 10 U/L (ref 8–46)
AST: 13 U/L (ref 12–32)
Albumin: 4.2 g/dL (ref 3.6–5.1)
Alkaline phosphatase (APISO): 55 U/L (ref 46–169)
BUN: 19 mg/dL (ref 7–20)
CO2: 23 mmol/L (ref 20–32)
Calcium: 8.8 mg/dL — ABNORMAL LOW (ref 8.9–10.4)
Chloride: 103 mmol/L (ref 98–110)
Creat: 0.71 mg/dL (ref 0.60–1.24)
Globulin: 3.2 g/dL (calc) (ref 2.1–3.5)
Glucose, Bld: 75 mg/dL (ref 65–139)
Potassium: 3.8 mmol/L (ref 3.8–5.1)
Sodium: 136 mmol/L (ref 135–146)
Total Bilirubin: 0.4 mg/dL (ref 0.2–1.1)
Total Protein: 7.4 g/dL (ref 6.3–8.2)

## 2022-07-02 LAB — CBC WITH DIFFERENTIAL/PLATELET
Absolute Monocytes: 571 cells/uL (ref 200–950)
Basophils Absolute: 41 cells/uL (ref 0–200)
Basophils Relative: 0.6 %
Eosinophils Absolute: 88 cells/uL (ref 15–500)
Eosinophils Relative: 1.3 %
HCT: 38.1 % — ABNORMAL LOW (ref 38.5–50.0)
Hemoglobin: 12.4 g/dL — ABNORMAL LOW (ref 13.2–17.1)
Lymphs Abs: 1924 cells/uL (ref 850–3900)
MCH: 27.1 pg (ref 27.0–33.0)
MCHC: 32.5 g/dL (ref 32.0–36.0)
MCV: 83.4 fL (ref 80.0–100.0)
MPV: 10 fL (ref 7.5–12.5)
Monocytes Relative: 8.4 %
Neutro Abs: 4175 cells/uL (ref 1500–7800)
Neutrophils Relative %: 61.4 %
Platelets: 309 10*3/uL (ref 140–400)
RBC: 4.57 10*6/uL (ref 4.20–5.80)
RDW: 12.1 % (ref 11.0–15.0)
Total Lymphocyte: 28.3 %
WBC: 6.8 10*3/uL (ref 3.8–10.8)

## 2022-07-02 LAB — PROLACTIN: Prolactin: 21.3 ng/mL — ABNORMAL HIGH (ref 2.0–18.0)

## 2022-07-02 LAB — TESTOS,TOTAL,FREE AND SHBG (FEMALE)
Free Testosterone: 0.6 pg/mL — ABNORMAL LOW (ref 35.0–155.0)
Sex Hormone Binding: 118.9 nmol/L — ABNORMAL HIGH (ref 10–50)
Testosterone, Total, LC-MS-MS: 10 ng/dL — ABNORMAL LOW (ref 250–1100)

## 2022-07-02 LAB — LH, PEDIATRICS: LH, Pediatrics: <0.02 m[IU]/mL

## 2022-07-02 LAB — LIPID PANEL
Cholesterol: 156 mg/dL (ref <170)
HDL: 48 mg/dL (ref >45)
LDL Cholesterol (Calc): 91 mg/dL (calc) (ref <110)
Non-HDL Cholesterol (Calc): 108 mg/dL (calc) (ref <120)
Total CHOL/HDL Ratio: 3.3 (calc) (ref <5.0)
Triglycerides: 84 mg/dL (ref <90)

## 2022-07-02 LAB — ESTRADIOL, ULTRA SENS: Estradiol, Ultra Sensitive: 610 pg/mL — ABNORMAL HIGH (ref <=29)

## 2022-07-05 ENCOUNTER — Encounter (INDEPENDENT_AMBULATORY_CARE_PROVIDER_SITE_OTHER): Payer: Self-pay | Admitting: Pediatric Endocrinology

## 2022-07-13 ENCOUNTER — Encounter (INDEPENDENT_AMBULATORY_CARE_PROVIDER_SITE_OTHER): Payer: Self-pay | Admitting: Pediatric Endocrinology

## 2022-07-26 ENCOUNTER — Telehealth: Payer: Self-pay | Admitting: Family Medicine

## 2022-07-27 ENCOUNTER — Ambulatory Visit (INDEPENDENT_AMBULATORY_CARE_PROVIDER_SITE_OTHER): Payer: Medicaid Other | Admitting: Family Medicine

## 2022-07-27 ENCOUNTER — Encounter: Payer: Self-pay | Admitting: Family Medicine

## 2022-07-27 VITALS — BP 126/76 | HR 101 | Temp 98.0°F | Resp 16 | Ht 71.0 in | Wt 156.0 lb

## 2022-07-27 DIAGNOSIS — B009 Herpesviral infection, unspecified: Secondary | ICD-10-CM | POA: Diagnosis not present

## 2022-07-27 DIAGNOSIS — Z7689 Persons encountering health services in other specified circumstances: Secondary | ICD-10-CM | POA: Diagnosis not present

## 2022-07-27 DIAGNOSIS — I10 Essential (primary) hypertension: Secondary | ICD-10-CM

## 2022-07-27 MED ORDER — VALACYCLOVIR HCL 500 MG PO TABS: 500.0000 mg | ORAL_TABLET | Freq: Two times a day (BID) | ORAL | 3 refills | Status: DC

## 2022-07-27 NOTE — Assessment & Plan Note (Signed)
Chronic, stable Remains on lisinopril at 5 mg

## 2022-07-27 NOTE — Progress Notes (Signed)
New patient visit   I,Matthew Parrish,acting as a scribe for Jacky Kindle, FNP.,have documented all relevant documentation on the behalf of Jacky Kindle, FNP,as directed by  Jacky Kindle, FNP while in the presence of Jacky Kindle, FNP.    Patient: Matthew Parrish   DOB: 2000/11/22   21 y.o. Adult  MRN: [id number] Visit Date: 07/27/2022  Today's healthcare provider: Jacky Kindle, FNP  Patient presents for new patient visit to establish care.  Introduced to Publishing rights manager role and practice setting.  All questions answered.  Discussed provider/patient relationship and expectations.   Chief Complaint  Patient presents with   Establish Care   Subjective    Matthew Parrish is a 21 y.o. adult who presents today as a new patient to establish care.  HPI    Past Medical History:  Diagnosis Date   Gender dysphoria    HSV-1 infection    Hypertension    Past Surgical History:  Procedure Laterality Date   BREAST ENHANCEMENT SURGERY Bilateral 05/09/2022   Procedure: MAMMOPLASTY AUGMENTATION (BREAST);  Surgeon: Glenna Fellows, MD;  Location: [facility];  Service: Plastics;  Laterality: Bilateral;   WISDOM TOOTH EXTRACTION  2017   Family Status  Relation Name Status   Mother  Alive   Father  Alive   Sister  Alive   Sister  Alive   Brother  Alive   MGM  Alive   MGF  Alive   PGM  Alive   PGF  Alive   Family History  Problem Relation Age of Onset   Diabetes Father    Eczema Sister    Healthy Sister    Healthy Brother    Hypertension Maternal Grandmother    Diabetes Maternal Grandfather    Hypertension Maternal Grandfather    Glaucoma Maternal Grandfather    Diabetes type II Maternal Grandfather    Social History   Socioeconomic History   Marital status: Single    Spouse name: Not on file   Number of children: Not on file   Years of education: Not on file   Highest education level: Not on file  Occupational History   Not on  file  Tobacco Use   Smoking status: Never    Passive exposure: Yes   Smokeless tobacco: Never   Tobacco comments:    mom smokes outside  Vaping Use   Vaping Use: Never used  Substance and Sexual Activity   Alcohol use: Never   Drug use: Never   Sexual activity: Not on file  Other Topics Concern   Not on file  Social History Narrative   Lives with mom, sister, brother, grandma.       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   estradiol (ESTRACE) 2 MG tablet Take 2 mg by mouth daily.   estradiol valerate (DELESTROGEN) 40 MG/ML injection Use an insulin syringe and draw up 13 units (5mg ). Inject subcutaneous. Once every 7 days.   Insulin Syringe-Needle U-100 (INSULIN SYRINGE .5CC/28G) 28G X 1/2" 0.5 ML MISC Use  as directed with estradiol   lisinopril (ZESTRIL) 5 MG tablet TAKE 1 TABLET (5 MG TOTAL) BY MOUTH DAILY.   medroxyPROGESTERone (DEPO-PROVERA) 150 MG/ML injection Inject 1 mL (150 mg total) into the muscle every 3 (three) months.   spironolactone (ALDACTONE) 50 MG tablet TAKE 1 TABLET BY MOUTH EVERY DAY   [DISCONTINUED] valACYclovir (VALTREX) 500 MG tablet Take 500 mg by mouth 2 (two) times daily.   Cholecalciferol (VITAMIN D) 125 MCG (5000 UT) CAPS Take by mouth. (Patient not taking: Reported on 07/27/2022)   DESCOVY 200-25 MG tablet Take 1 tablet by mouth daily.   No facility-administered medications prior to visit.   No Known Allergies   There is no immunization history on file for this patient.  Health Maintenance  Topic Date Due   COVID-19 Vaccine (1) Never done   HPV VACCINES (1 - 2-dose  series) Never done   HIV Screening  Never done   Hepatitis C Screening  Never done   DTaP/Tdap/Td (1 - Tdap) Never done   INFLUENZA VACCINE  10/12/2022    Patient Care Team: Jacky Kindle, FNP as PCP - General (Family Medicine)  Review of Systems  Constitutional:  Negative for appetite change, chills and fever.  Respiratory:  Negative for chest tightness, shortness of breath and wheezing.   Cardiovascular:  Negative for chest pain and palpitations.  Gastrointestinal:  Negative for abdominal pain, nausea and vomiting.    Objective    BP 126/76   Pulse (!) 101   Temp 98 F (36.7 C) (Oral)   Resp 16   Ht 5\' 11"  (1.803 m)   Wt 156 lb (70.8 kg)   SpO2 100% Comment: room air  BMI 21.76 kg/m   Physical Exam Vitals and nursing note reviewed.  Constitutional:      General: She is not in acute distress.    Appearance: Normal appearance. She is normal weight. She is not ill-appearing, toxic-appearing or diaphoretic.  HENT:     Head: Normocephalic and atraumatic.  Cardiovascular:     Rate and Rhythm: Regular rhythm. Tachycardia present.     Pulses: Normal pulses.     Heart sounds: Normal heart sounds. No murmur heard.    No friction rub. No gallop.  Pulmonary:     Effort: Pulmonary effort is normal. No respiratory distress.     Breath sounds: Normal breath sounds. No stridor. No wheezing, rhonchi or rales.  Chest:     Chest wall: No tenderness.  Musculoskeletal:        General: No swelling, tenderness, deformity or signs of injury. Normal range of motion.     Right lower leg: No edema.     Left lower leg: No edema.  Skin:    General: Skin is warm and dry.     Capillary Refill: Capillary refill takes less than 2 seconds.     Coloration: Skin is not jaundiced or pale.     Findings: No bruising, erythema, lesion or rash.  Neurological:     General: No focal deficit present.     Mental Status: She is alert and oriented to person, place, and time. Mental status is at  baseline.     Cranial Nerves: No cranial nerve deficit.     Sensory: No sensory deficit.     Motor: No weakness.     Coordination: Coordination normal.  Psychiatric:        Mood and Affect: Mood normal.        Behavior: Behavior normal.  Thought Content: Thought content normal.        Judgment: Judgment normal.    Depression Screen    07/27/2022    3:30 PM 07/07/2020   10:56 AM  PHQ 2/9 Scores  PHQ - 2 Score 2 0  PHQ- 9 Score 2 3   No results found for any visits on 07/27/22.  Assessment & Plan      Problem List Items Addressed This Visit       Cardiovascular and Mediastinum   Essential hypertension - Primary    Chronic, stable Remains on lisinopril at 5 mg         Other   Encounter to establish care    Previously seen by peds; multi disc medical team in setting of gender dysphoria, male to male Has specialists at this time Hx of HTN Advised HPV vaccines as well as Menvio Encouraged to get routine blood work and STI screenings with hx of PReP      Return for annual examination, immunization.    Leilani Merl, FNP, have reviewed all documentation for this visit. The documentation on 07/27/22 for the exam, diagnosis, procedures, and orders are all accurate and complete.  Jacky Kindle, FNP  [facility] [phone] (phone) [phone] (fax)  [facility]

## 2022-07-27 NOTE — Assessment & Plan Note (Signed)
Acute on chronic, endorses both HSV 1 on/in oral mucosal as well as penis Request for change from ppx to daily given increase in epsiodes since dx Advise to start with 500 mg daily and may increase to 1000 mg daily- either in 500 mg BID or 1000 mg QD [2-500] if 500 mg fails

## 2022-07-27 NOTE — Assessment & Plan Note (Signed)
Previously seen by peds; multi disc medical team in setting of gender dysphoria, male to male Has specialists at this time Hx of HTN Advised HPV vaccines as well as Menvio Encouraged to get routine blood work and STI screenings with hx of PReP

## 2022-08-03 ENCOUNTER — Telehealth: Payer: Self-pay | Admitting: Family Medicine

## 2022-08-09 ENCOUNTER — Encounter: Payer: Self-pay | Admitting: Family Medicine

## 2022-08-10 ENCOUNTER — Other Ambulatory Visit: Payer: Self-pay

## 2022-08-10 DIAGNOSIS — B009 Herpesviral infection, unspecified: Secondary | ICD-10-CM

## 2022-08-10 MED ORDER — VALACYCLOVIR HCL 500 MG PO TABS: 500.0000 mg | ORAL_TABLET | Freq: Two times a day (BID) | ORAL | 3 refills | Status: DC

## 2022-08-17 ENCOUNTER — Encounter: Payer: Self-pay | Admitting: Family Medicine

## 2022-08-17 ENCOUNTER — Other Ambulatory Visit: Payer: Self-pay | Admitting: Family Medicine

## 2022-08-17 ENCOUNTER — Ambulatory Visit (INDEPENDENT_AMBULATORY_CARE_PROVIDER_SITE_OTHER): Payer: Medicaid Other | Admitting: Family Medicine

## 2022-08-17 ENCOUNTER — Other Ambulatory Visit (HOSPITAL_COMMUNITY)
Admission: RE | Admit: 2022-08-17 | Discharge: 2022-08-17 | Disposition: A | Discharge status: Home / Self Care | Payer: Medicaid Other | Source: Ambulatory Visit | Attending: Family Medicine | Admitting: Family Medicine

## 2022-08-17 VITALS — BP 125/91 | HR 107 | Temp 98.4°F | Resp 15 | Ht 71.0 in | Wt 162.7 lb

## 2022-08-17 DIAGNOSIS — Z113 Encounter for screening for infections with a predominantly sexual mode of transmission: Secondary | ICD-10-CM

## 2022-08-17 DIAGNOSIS — I10 Essential (primary) hypertension: Secondary | ICD-10-CM

## 2022-08-17 DIAGNOSIS — B081 Molluscum contagiosum: Secondary | ICD-10-CM | POA: Diagnosis not present

## 2022-08-17 DIAGNOSIS — Z23 Encounter for immunization: Secondary | ICD-10-CM | POA: Diagnosis not present

## 2022-08-17 DIAGNOSIS — N489 Disorder of penis, unspecified: Secondary | ICD-10-CM | POA: Diagnosis not present

## 2022-08-17 NOTE — Progress Notes (Signed)
Established patient visit   Patient: Matthew Parrish   DOB: October 01, 2000   21 y.o. Adult  MRN: [id number] Visit Date: 08/17/2022  Today's healthcare provider: Jacky Kindle, FNP  Re Introduced to nurse practitioner role and practice setting.  All questions answered.  Discussed provider/patient relationship and expectations.  Penile lesion; high risk sexual behavior.  Subjective    HPI   Medications: Outpatient Medications Prior to Visit  Medication Sig   DESCOVY 200-25 MG tablet Take 1 tablet by mouth daily.   doxycycline (VIBRAMYCIN) 100 MG capsule Take 100 mg by mouth 2 (two) times daily.   estradiol (ESTRACE) 2 MG tablet Take 2 mg by mouth daily.   estradiol valerate (DELESTROGEN) 40 MG/ML injection Use an insulin syringe and draw up 13 units (5mg ). Inject subcutaneous. Once every 7 days.   glucagon (human recombinant) in dextrose 5 % 50 mL Inject into the vein once.   Insulin Syringe-Needle U-100 (INSULIN SYRINGE .5CC/28G) 28G X 1/2" 0.5 ML MISC Use as directed with estradiol   medroxyPROGESTERone (DEPO-PROVERA) 150 MG/ML injection Inject 1 mL (150 mg total) into the muscle every 3 (three) months.   selenium 50 MCG TABS tablet Take 50 mcg by mouth daily.   spironolactone (ALDACTONE) 50 MG tablet TAKE 1 TABLET BY MOUTH EVERY DAY   valACYclovir (VALTREX) 500 MG tablet Take 1 tablet (500 mg total) by mouth 2 (two) times daily.   zinc gluconate 50 MG tablet Take 50 mg by mouth daily.   [DISCONTINUED] lisinopril (ZESTRIL) 5 MG tablet TAKE 1 TABLET (5 MG TOTAL) BY MOUTH DAILY.   Cholecalciferol (VITAMIN D) 125 MCG (5000 UT) CAPS Take by mouth. (Patient not taking: Reported on 07/27/2022)   No facility-administered medications prior to visit.    Review of Systems    Objective    BP (!) 125/91 (BP Location: Left Arm, Patient Position: Sitting, Cuff Size: Normal)   Pulse (!) 107   Temp 98.4 F (36.9 C) (Oral)   Resp 15   Ht 5\' 11"  (1.803 m)   Wt 162 lb 11.2 oz (73.8  kg)   SpO2 100%   BMI 22.69 kg/m   Physical Exam Vitals and nursing note reviewed. Exam conducted with a chaperone present.  Constitutional:      Appearance: Normal appearance. She is normal weight.  HENT:     Head: Normocephalic and atraumatic.  Cardiovascular:     Rate and Rhythm: Normal rate and regular rhythm.     Pulses: Normal pulses.     Heart sounds: Normal heart sounds.  Pulmonary:     Effort: Pulmonary effort is normal.     Breath sounds: Normal breath sounds.  Genitourinary:    Penis: Circumcised. Lesions present.      Testes: Normal.     Tanner stage (genital): 5.     Comments: Chronic lesions; known HSV. Pt concern for HPV as well; do not appear to be HPV. However, possible molluscum appearance with central ulceration  Musculoskeletal:        General: Normal range of motion.     Cervical back: Normal range of motion.  Skin:    General: Skin is warm and dry.     Capillary Refill: Capillary refill takes less than 2 seconds.  Neurological:     General: No focal deficit present.     Mental Status: She is alert and oriented to person, place, and time. Mental status is at baseline.  Psychiatric:  Mood and Affect: Mood normal.        Behavior: Behavior normal.        Thought Content: Thought content normal.        Judgment: Judgment normal.     Results for orders placed or performed in visit on 08/17/22  Herpes simplex virus culture   Specimen: Penile; Other   Other  Result Value Ref Range   HSV Culture/Type Comment   HIV antibody (with reflex)  Result Value Ref Range   HIV Screen 4th Generation wRfx Non Reactive Non Reactive  Hepatitis C Antibody  Result Value Ref Range   Hep C Virus Ab WILL FOLLOW   RPR  Result Value Ref Range   RPR Ser Ql Non Reactive Non Reactive  HSV 1 and 2 Ab, IgG  Result Value Ref Range   HSV 1 Glycoprotein G Ab, IgG 18.30 (H) 0.00 - 0.90 index   HSV 2 IgG, Type Spec <0.91 0.00 - 0.90 index    Assessment & Plan      Problem List Items Addressed This Visit       Cardiovascular and Mediastinum   Essential hypertension    Chronic, elevated Recommend increase of lisinopril from 5 mg to 10 mg Goal <140/<90      Relevant Medications   lisinopril (ZESTRIL) 10 MG tablet     Genitourinary   Penile lesion - Primary    Acute, variable No open ulcerations Viral swab performed C/f HSV vs HPV vs molluscum contagiosum       Relevant Orders   Herpes simplex virus culture (Completed)   Anaerobic and Aerobic Culture   Ambulatory referral to Dermatology     Other   Need for HPV vaccination    Consented; VIS made available; no immediate side effects following administration; plan to repeat 2 and 6 months       Relevant Orders   HPV 9-valent vaccine,Recombinat (Completed)   Anaerobic and Aerobic Culture   Routine screening for STI (sexually transmitted infection)    Pt with high risk sexual behavior; remains on both PREP and daily ABX to assist with G/C Repeat labs      Relevant Orders   Urine cytology ancillary only   HIV antibody (with reflex) (Completed)   Hepatitis C Antibody (Completed)   RPR (Completed)   HSV 1 and 2 Ab, IgG (Completed)   Virus culture   Anaerobic and Aerobic Culture   Other Visit Diagnoses     Molluscum contagiosum infection          Return in about 7 years (around 08/16/2029), or if symptoms worsen or fail to improve, for immunization.     Leilani Merl, FNP, have reviewed all documentation for this visit. The documentation on 08/20/22 for the exam, diagnosis, procedures, and orders are all accurate and complete.  Jacky Kindle, FNP  [facility] [phone] (phone) [phone] (fax)  [facility]

## 2022-08-18 NOTE — Progress Notes (Signed)
Urine and Hep C pending; will recommend increased use of valtrex to 1000 mg BID to assist with symptoms. All other screening negative at this time.

## 2022-08-19 ENCOUNTER — Encounter: Payer: Self-pay | Admitting: Family Medicine

## 2022-08-20 ENCOUNTER — Other Ambulatory Visit: Payer: Self-pay | Admitting: Family Medicine

## 2022-08-20 ENCOUNTER — Encounter: Payer: Self-pay | Admitting: Family Medicine

## 2022-08-20 DIAGNOSIS — N489 Disorder of penis, unspecified: Secondary | ICD-10-CM | POA: Insufficient documentation

## 2022-08-20 DIAGNOSIS — I10 Essential (primary) hypertension: Secondary | ICD-10-CM

## 2022-08-20 MED ORDER — LISINOPRIL 10 MG PO TABS: 10.0000 mg | ORAL_TABLET | Freq: Every day | ORAL | 3 refills | Status: DC

## 2022-08-20 NOTE — Assessment & Plan Note (Signed)
Chronic, elevated Recommend increase of lisinopril from 5 mg to 10 mg Goal <140/<90

## 2022-08-20 NOTE — Assessment & Plan Note (Signed)
Consented; VIS made available; no immediate side effects following administration; plan to repeat 2 and 6 months

## 2022-08-20 NOTE — Assessment & Plan Note (Signed)
Acute, variable No open ulcerations Viral swab performed C/f HSV vs HPV vs molluscum contagiosum

## 2022-08-20 NOTE — Assessment & Plan Note (Signed)
Pt with high risk sexual behavior; remains on both PREP and daily ABX to assist with G/C Repeat labs

## 2022-08-21 LAB — HERPES SIMPLEX VIRUS CULTURE

## 2022-08-22 LAB — HEPATITIS C ANTIBODY: Hep C Virus Ab: NONREACTIVE

## 2022-08-22 LAB — HSV 1 AND 2 AB, IGG
HSV 1 Glycoprotein G Ab, IgG: 18.3 index — ABNORMAL HIGH (ref 0.00–0.90)
HSV 2 IgG, Type Spec: <0.91 index (ref 0.00–0.90)

## 2022-08-22 LAB — HIV ANTIBODY (ROUTINE TESTING W REFLEX): HIV Screen 4th Generation wRfx: NONREACTIVE

## 2022-08-22 LAB — RPR: RPR Ser Ql: NONREACTIVE

## 2022-08-23 LAB — ANAEROBIC AND AEROBIC CULTURE

## 2022-08-24 ENCOUNTER — Telehealth: Payer: Self-pay

## 2022-08-24 NOTE — Telephone Encounter (Signed)
Copied from CRM [id number]. Topic: General - Other >> Aug 24, 2022 11:24 AM Carrielelia G wrote: Reason for CRM:  Please call regarding Urine Sample  (was on hold a long time )

## 2022-08-25 ENCOUNTER — Ambulatory Visit: Payer: Self-pay | Admitting: *Deleted

## 2022-08-25 ENCOUNTER — Encounter: Payer: Self-pay | Admitting: Family Medicine

## 2022-08-25 LAB — URINE CYTOLOGY ANCILLARY ONLY
Chlamydia: NEGATIVE
Comment: NEGATIVE
Comment: NEGATIVE
Comment: NORMAL
Neisseria Gonorrhea: NEGATIVE
Trichomonas: NEGATIVE

## 2022-08-25 NOTE — Progress Notes (Signed)
Normal urine cytology

## 2022-08-25 NOTE — Telephone Encounter (Signed)
Reason for Disposition  General information question, no triage required and triager able to answer question    Lab results not in yet  Answer Assessment - Initial Assessment Questions 1. REASON FOR CALL or QUESTION: "What is your reason for calling today?" or "How can I best help you?" or "What question do you have that I can help answer?"     Pt called in for her lab results.   She was beginning to get results on her MyChart.   The provider has not seen them yet.   I let her know she would get a call once Merita Norton, FNP had seen and interpreted them.  Protocols used: Information Only Call - No Triage-A-AH

## 2022-08-25 NOTE — Telephone Encounter (Signed)
  Chief Complaint: Called in for lab results.   Not interpreted by provider yet.  I let her know she would get a call once they were in. Symptoms: N/A Frequency: N/A Pertinent Negatives: Patient denies N/A Disposition: [] ED /[] Urgent Care (no appt availability in office) / [] Appointment(In office/virtual)/ []  [facility]/ [x] Home Care/ [] Refused Recommended Disposition /[] [facility]/ []  Follow-up with PCP Additional Notes: Agreeable to getting called once all lab results are in.

## 2022-08-29 NOTE — Telephone Encounter (Signed)
Pt aware w/ lab results.

## 2022-09-07 ENCOUNTER — Ambulatory Visit: Payer: Medicaid Other | Admitting: Family Medicine

## 2022-09-15 ENCOUNTER — Encounter (INDEPENDENT_AMBULATORY_CARE_PROVIDER_SITE_OTHER): Payer: Self-pay

## 2022-09-25 ENCOUNTER — Ambulatory Visit (INDEPENDENT_AMBULATORY_CARE_PROVIDER_SITE_OTHER): Payer: Medicaid Other | Admitting: Pediatric Endocrinology

## 2022-09-27 ENCOUNTER — Ambulatory Visit: Payer: Medicaid Other | Admitting: Family Medicine

## 2022-09-27 ENCOUNTER — Ambulatory Visit (INDEPENDENT_AMBULATORY_CARE_PROVIDER_SITE_OTHER): Payer: Medicaid Other | Admitting: Family Medicine

## 2022-09-27 ENCOUNTER — Other Ambulatory Visit: Payer: Self-pay | Admitting: Family Medicine

## 2022-09-27 VITALS — BP 120/71 | HR 96 | Ht 71.0 in | Wt 163.3 lb

## 2022-09-27 DIAGNOSIS — N6011 Diffuse cystic mastopathy of right breast: Secondary | ICD-10-CM | POA: Diagnosis not present

## 2022-09-27 DIAGNOSIS — Z9882 Breast implant status: Secondary | ICD-10-CM

## 2022-09-27 DIAGNOSIS — F40240 Claustrophobia: Secondary | ICD-10-CM

## 2022-09-27 MED ORDER — DIAZEPAM 5 MG PO TABS
5.0000 mg | ORAL_TABLET | Freq: Four times a day (QID) | ORAL | 0 refills | Status: DC | PRN
Start: 2022-09-27 — End: 2023-07-02
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
## 2022-09-28 ENCOUNTER — Encounter (INDEPENDENT_AMBULATORY_CARE_PROVIDER_SITE_OTHER): Payer: Self-pay

## 2022-09-28 ENCOUNTER — Telehealth: Payer: Self-pay

## 2022-09-28 NOTE — Telephone Encounter (Signed)
Left message for patient to make her aware that the requested order for imaging has been approved and she can keep the scheduled appointment for tomorrow. Advised patient to give the office a call back if she has any questions or concerns.

## 2022-09-28 NOTE — Telephone Encounter (Signed)
-----   Message from Jacky Kindle sent at 09/28/2022  9:00 AM EDT -----  ----- Message ----- From: Unknown Foley Sent: 09/28/2022   8:44 AM EDT To: Jacky Kindle, FNP  Please review incoming fax

## 2022-09-29 ENCOUNTER — Other Ambulatory Visit: Payer: Medicaid Other

## 2022-10-17 ENCOUNTER — Ambulatory Visit (INDEPENDENT_AMBULATORY_CARE_PROVIDER_SITE_OTHER): Payer: Medicaid Other | Admitting: Family Medicine

## 2022-10-17 VITALS — BP 132/85 | HR 87 | Wt 164.8 lb

## 2022-10-17 DIAGNOSIS — I10 Essential (primary) hypertension: Secondary | ICD-10-CM | POA: Diagnosis not present

## 2022-10-17 DIAGNOSIS — A609 Anogenital herpesviral infection, unspecified: Secondary | ICD-10-CM | POA: Insufficient documentation

## 2022-10-17 DIAGNOSIS — Z23 Encounter for immunization: Secondary | ICD-10-CM | POA: Diagnosis not present

## 2022-10-17 MED ORDER — LISINOPRIL-HYDROCHLOROTHIAZIDE 10-12.5 MG PO TABS: 1.0000 | ORAL_TABLET | Freq: Every day | ORAL | 3 refills | Status: DC

## 2022-10-17 NOTE — Progress Notes (Signed)
Established patient visit   Patient: Matthew Parrish   DOB: 2000/07/31   22 y.o. Adult  MRN: [id number] Visit Date: 10/17/2022  Today's healthcare provider: Jacky Kindle, FNP  Introduced to nurse practitioner role and practice setting.  All questions answered.  Discussed provider/patient relationship and expectations.  Subjective    HPI   Medications: Outpatient Medications Prior to Visit  Medication Sig   Cholecalciferol (VITAMIN D) 125 MCG (5000 UT) CAPS Take by mouth.   DESCOVY 200-25 MG tablet Take 1 tablet by mouth daily.   diazepam (VALIUM) 5 MG tablet Take 1 tablet (5 mg total) by mouth every 6 (six) hours as needed for anxiety.   doxycycline (VIBRAMYCIN) 100 MG capsule Take 100 mg by mouth 2 (two) times daily.   estradiol (ESTRACE) 2 MG tablet Take 2 mg by mouth daily.   estradiol valerate (DELESTROGEN) 40 MG/ML injection Use an insulin syringe and draw up 13 units (5mg ). Inject subcutaneous. Once every 7 days.   glucagon (human recombinant) in dextrose 5 % 50 mL Inject into the vein once.   Insulin Syringe-Needle U-100 (INSULIN SYRINGE .5CC/28G) 28G X 1/2" 0.5 ML MISC Use as directed with estradiol   medroxyPROGESTERone (DEPO-PROVERA) 150 MG/ML injection Inject 1 mL (150 mg total) into the muscle every 3 (three) months.   selenium 50 MCG TABS tablet Take 50 mcg by mouth daily.   spironolactone (ALDACTONE) 50 MG tablet TAKE 1 TABLET BY MOUTH EVERY DAY   valACYclovir (VALTREX) 500 MG tablet Take 1 tablet (500 mg total) by mouth 2 (two) times daily.   zinc gluconate 50 MG tablet Take 50 mg by mouth daily.   [DISCONTINUED] lisinopril (ZESTRIL) 10 MG tablet Take 1 tablet (10 mg total) by mouth daily.   No facility-administered medications prior to visit.    Review of Systems    Objective    BP 132/85 (BP Location: Left Arm, Patient Position: Sitting, Cuff Size: Normal)   Pulse 87   Wt 164 lb 12.8 oz (74.8 kg)   SpO2 100%   BMI 22.98 kg/m  BP Readings from  Last 3 Encounters:  10/17/22 132/85  09/27/22 120/71  08/17/22 (!) 125/91   Wt Readings from Last 3 Encounters:  10/17/22 164 lb 12.8 oz (74.8 kg) (90%, Z= 1.26)*  09/27/22 163 lb 4.8 oz (74.1 kg) (89%, Z= 1.22)*  08/17/22 162 lb 11.2 oz (73.8 kg) (89%, Z= 1.21)*   * Growth percentiles are based on CDC (Girls, 2-20 Years) data.   Physical Exam Vitals and nursing note reviewed.  Constitutional:      General: She is not in acute distress.    Appearance: Normal appearance. She is normal weight. She is not ill-appearing, toxic-appearing or diaphoretic.  HENT:     Head: Normocephalic and atraumatic.  Cardiovascular:     Rate and Rhythm: Normal rate and regular rhythm.     Pulses: Normal pulses.     Heart sounds: Normal heart sounds. No murmur heard.    No friction rub. No gallop.  Pulmonary:     Effort: Pulmonary effort is normal. No respiratory distress.     Breath sounds: Normal breath sounds. No stridor. No wheezing, rhonchi or rales.  Chest:     Chest wall: No tenderness.  Musculoskeletal:        General: No swelling, tenderness, deformity or signs of injury. Normal range of motion.     Right lower leg: No edema.     Left lower leg: No  edema.  Skin:    General: Skin is warm and dry.     Capillary Refill: Capillary refill takes less than 2 seconds.     Coloration: Skin is not jaundiced or pale.     Findings: No bruising, erythema, lesion or rash.  Neurological:     General: No focal deficit present.     Mental Status: She is alert and oriented to person, place, and time. Mental status is at baseline.     Cranial Nerves: No cranial nerve deficit.     Sensory: No sensory deficit.     Motor: No weakness.     Coordination: Coordination normal.  Psychiatric:        Mood and Affect: Mood normal.        Behavior: Behavior normal.        Thought Content: Thought content normal.        Judgment: Judgment normal.     No results found for any visits on 10/17/22.  Assessment &  Plan     Problem List Items Addressed This Visit       Cardiovascular and Mediastinum   Essential hypertension - Primary    Chronic, borderline Increase to 10-12.5 mg to assist Goal for 129/79      Relevant Medications   lisinopril-hydrochlorothiazide (ZESTORETIC) 10-12.5 MG tablet     Other   HSV (herpes simplex virus) anogenital infection    Chronic, stable Referral previously in for derm; encouraged to contact Stable lesions with use of antivirals       Immunization due   Relevant Orders   HPV 9-valent vaccine,Recombinat (Completed)   No follow-ups on file.     Leilani Merl, FNP, have reviewed all documentation for this visit. The documentation on 10/17/22 for the exam, diagnosis, procedures, and orders are all accurate and complete.  Jacky Kindle, FNP  [facility] [phone] (phone) [phone] (fax)  [facility]

## 2022-10-17 NOTE — Assessment & Plan Note (Signed)
Chronic, stable Referral previously in for derm; encouraged to contact Stable lesions with use of antivirals

## 2022-10-17 NOTE — Assessment & Plan Note (Signed)
Chronic, borderline Increase to 10-12.5 mg to assist Goal for 129/79

## 2022-10-17 NOTE — Patient Instructions (Signed)
[phone] Matthew Parrish

## 2022-10-23 ENCOUNTER — Ambulatory Visit (INDEPENDENT_AMBULATORY_CARE_PROVIDER_SITE_OTHER): Payer: Medicaid Other | Admitting: Pediatric Endocrinology

## 2022-11-16 ENCOUNTER — Ambulatory Visit (INDEPENDENT_AMBULATORY_CARE_PROVIDER_SITE_OTHER): Payer: Medicaid Other | Admitting: Pediatric Endocrinology

## 2022-11-24 ENCOUNTER — Ambulatory Visit (INDEPENDENT_AMBULATORY_CARE_PROVIDER_SITE_OTHER): Payer: Medicaid Other | Admitting: Family Medicine

## 2022-11-24 ENCOUNTER — Telehealth: Payer: Self-pay

## 2022-11-24 VITALS — BP 134/88 | HR 90 | Temp 98.0°F | Ht 71.0 in | Wt 174.1 lb

## 2022-11-24 DIAGNOSIS — Z789 Other specified health status: Secondary | ICD-10-CM | POA: Diagnosis not present

## 2022-11-24 DIAGNOSIS — Z23 Encounter for immunization: Secondary | ICD-10-CM | POA: Diagnosis not present

## 2022-11-24 DIAGNOSIS — E349 Endocrine disorder, unspecified: Secondary | ICD-10-CM

## 2022-11-24 DIAGNOSIS — I10 Essential (primary) hypertension: Secondary | ICD-10-CM | POA: Diagnosis not present

## 2022-11-24 MED ORDER — MEDROXYPROGESTERONE ACETATE 150 MG/ML IM SUSP
150.0000 mg | INTRAMUSCULAR | 1 refills | Status: DC
Start: 2022-11-24 — End: 2022-11-29

## 2022-11-24 MED ORDER — SPIRONOLACTONE 50 MG PO TABS
50.0000 mg | ORAL_TABLET | Freq: Every day | ORAL | 3 refills | Status: DC
Start: 2022-11-24 — End: 2023-03-12

## 2022-11-24 MED ORDER — ESTRADIOL VALERATE 40 MG/ML IM OIL: TOPICAL_OIL | INTRAMUSCULAR | 0 refills | Status: DC

## 2022-11-26 ENCOUNTER — Encounter: Payer: Self-pay | Admitting: Family Medicine

## 2022-11-26 NOTE — Assessment & Plan Note (Signed)
Chronic, stable Transition from male to male with assistance of hormone assistance Doses all stable without concern for misuse

## 2022-11-26 NOTE — Progress Notes (Signed)
Established patient visit   Patient: Matthew Parrish   DOB: 26-Jun-2000   21 y.o. Adult  MRN: [id number] Visit Date: 11/24/2022  Today's healthcare provider: Jacky Kindle, FNP  Introduced to nurse practitioner role and practice setting.  All questions answered.  Discussed provider/patient relationship and expectations.  Subjective    HPI  Previous pediatric endocrinology has left the practice and patient request for consistent dose of previous hormone treatment to assist their gender identity process.  Medications: Outpatient Medications Prior to Visit  Medication Sig   Cholecalciferol (VITAMIN D) 125 MCG (5000 UT) CAPS Take by mouth.   DESCOVY 200-25 MG tablet Take 1 tablet by mouth daily.   diazepam (VALIUM) 5 MG tablet Take 1 tablet (5 mg total) by mouth every 6 (six) hours as needed for anxiety.   doxycycline (VIBRAMYCIN) 100 MG capsule Take 100 mg by mouth 2 (two) times daily.   estradiol (ESTRACE) 2 MG tablet Take 2 mg by mouth daily.   glucagon (human recombinant) in dextrose 5 % 50 mL Inject into the vein once.   Insulin Syringe-Needle U-100 (INSULIN SYRINGE .5CC/28G) 28G X 1/2" 0.5 ML MISC Use as directed with estradiol   lisinopril-hydrochlorothiazide (ZESTORETIC) 10-12.5 MG tablet Take 1 tablet by mouth daily.   selenium 50 MCG TABS tablet Take 50 mcg by mouth daily.   valACYclovir (VALTREX) 500 MG tablet Take 1 tablet (500 mg total) by mouth 2 (two) times daily.   zinc gluconate 50 MG tablet Take 50 mg by mouth daily.   [DISCONTINUED] estradiol valerate (DELESTROGEN) 40 MG/ML injection Use an insulin syringe and draw up 13 units (5mg ). Inject subcutaneous. Once every 7 days.   [DISCONTINUED] medroxyPROGESTERone (DEPO-PROVERA) 150 MG/ML injection Inject 1 mL (150 mg total) into the muscle every 3 (three) months.   [DISCONTINUED] spironolactone (ALDACTONE) 50 MG tablet TAKE 1 TABLET BY MOUTH EVERY DAY   No facility-administered medications prior to visit.      Objective    BP 134/88 (BP Location: Right Arm, Patient Position: Sitting, Cuff Size: Normal)   Pulse 90   Temp 98 F (36.7 C) (Oral)   Ht 5\' 11"  (1.803 m)   Wt 174 lb 1.6 oz (79 kg)   SpO2 100%   BMI 24.28 kg/m   Physical Exam Vitals and nursing note reviewed.  Constitutional:      Appearance: Normal appearance. She is normal weight.  HENT:     Head: Normocephalic and atraumatic.  Cardiovascular:     Rate and Rhythm: Normal rate and regular rhythm.     Pulses: Normal pulses.     Heart sounds: Normal heart sounds.  Pulmonary:     Effort: Pulmonary effort is normal.     Breath sounds: Normal breath sounds.  Chest:     Comments: Breast implants present Musculoskeletal:        General: Normal range of motion.     Cervical back: Normal range of motion.  Skin:    General: Skin is warm and dry.     Capillary Refill: Capillary refill takes less than 2 seconds.  Neurological:     General: No focal deficit present.     Mental Status: She is alert and oriented to person, place, and time. Mental status is at baseline.  Psychiatric:        Mood and Affect: Mood normal.        Behavior: Behavior normal.        Thought Content: Thought content normal.  Judgment: Judgment normal.     No results found for any visits on 11/24/22.  Assessment & Plan     Problem List Items Addressed This Visit       Cardiovascular and Mediastinum   Essential hypertension    Chronic, remains borderline Continue Zestoretic at 10-12.5 and spiro at 50      Relevant Medications   spironolactone (ALDACTONE) 50 MG tablet     Endocrine   Endocrine disorder related to puberty    Chronic, stable Transition from male to male with assistance of hormone assistance Doses all stable without concern for misuse      Relevant Medications   medroxyPROGESTERone (DEPO-PROVERA) 150 MG/ML injection   spironolactone (ALDACTONE) 50 MG tablet     Other   Immunization due   Relevant Orders   Flu  vaccine trivalent PF, 6mos and older(Flulaval,Afluria,Fluarix,Fluzone) (Completed)   Transgender - Primary   Relevant Medications   medroxyPROGESTERone (DEPO-PROVERA) 150 MG/ML injection   Other Visit Diagnoses     Influenza vaccine needed       Relevant Orders   Flu vaccine trivalent PF, 6mos and older(Flulaval,Afluria,Fluarix,Fluzone) (Completed)      No follow-ups on file.     Leilani Merl, FNP, have reviewed all documentation for this visit. The documentation on 11/26/22 for the exam, diagnosis, procedures, and orders are all accurate and complete.  Jacky Kindle, FNP  [facility] [phone] (phone) [phone] (fax)  [facility]

## 2022-11-26 NOTE — Assessment & Plan Note (Signed)
Chronic, remains borderline Continue Zestoretic at 10-12.5 and spiro at 50

## 2022-11-27 ENCOUNTER — Ambulatory Visit: Payer: Medicaid Other

## 2022-11-27 NOTE — Telephone Encounter (Signed)
Pharmacist. Advised verbalized understanding

## 2022-11-29 ENCOUNTER — Ambulatory Visit (INDEPENDENT_AMBULATORY_CARE_PROVIDER_SITE_OTHER): Payer: Medicaid Other | Admitting: Family Medicine

## 2022-11-29 DIAGNOSIS — Z789 Other specified health status: Secondary | ICD-10-CM | POA: Diagnosis not present

## 2022-11-29 DIAGNOSIS — E349 Endocrine disorder, unspecified: Secondary | ICD-10-CM | POA: Diagnosis not present

## 2022-11-29 DIAGNOSIS — Z3042 Encounter for surveillance of injectable contraceptive: Secondary | ICD-10-CM

## 2022-11-29 MED ORDER — MEDROXYPROGESTERONE ACETATE 150 MG/ML IM SUSY
150.0000 mg | PREFILLED_SYRINGE | Freq: Once | INTRAMUSCULAR | Status: AC
  Administered 2022-11-29: 150 mg via INTRAMUSCULAR

## 2022-11-29 MED ORDER — MEDROXYPROGESTERONE ACETATE 150 MG/ML IM SUSP
150.0000 mg | INTRAMUSCULAR | 1 refills | Status: DC
Start: 2022-11-29 — End: 2023-07-02

## 2022-11-29 NOTE — Progress Notes (Signed)
Provided by CMA; tolerated well. Advised on return date.  Jacky Kindle, FNP  [facility] [address] [place], [place] [address] [phone] (phone) [phone] (fax) [facility]

## 2023-01-21 ENCOUNTER — Other Ambulatory Visit: Payer: Self-pay | Admitting: Family Medicine

## 2023-01-23 NOTE — Telephone Encounter (Signed)
Requested medication (s) are due for refill today: yes  Requested medication (s) are on the active medication list: yes  Last refill:  11/24/22  Future visit scheduled: yes  Notes to clinic:  Medication not assigned to a protocol, review manually.      Requested Prescriptions  Pending Prescriptions Disp Refills   estradiol valerate (DELESTROGEN) 40 MG/ML injection [Pharmacy Med Name: ESTRADIOL VALERATE 40MG /ML INJ, 5ML] 5 mL 0    Sig: USE AN INSULIN SYRINGE TO DRAW UP 13 UNITS(5MG ) UNDER THE SKIN ONCE EVERY 7 DAYS AS DIRECTED. DISCARD REMAINDER AFTER 1 MONTH.     Off-Protocol Failed - 01/21/2023 11:38 AM      Failed - Medication not assigned to a protocol, review manually.      Passed - Valid encounter within last 12 months    Recent Outpatient Visits           2 months ago Transgender   [facility] Merita Norton T, FNP   3 months ago Essential hypertension   [facility] Merita Norton T, FNP   3 months ago Breast changes, fibrocystic, right   [facility] Jacky Kindle, FNP   5 months ago Penile lesion   [facility] Jacky Kindle, FNP   6 months ago Essential hypertension   [facility] Jacky Kindle, FNP       Future Appointments             In 3 weeks Jacky Kindle, FNP [facility], [facility]   In 6 months Bacigalupo, Marzella Schlein, MD [facility], [facility]

## 2023-01-25 ENCOUNTER — Other Ambulatory Visit: Payer: Self-pay | Admitting: Family Medicine

## 2023-01-25 NOTE — Telephone Encounter (Signed)
Medication Refill -  Most Recent Primary Care Visit:  Provider: Merita Norton T  Department: [facility]  Visit Type: NURSE VISIT  Date: 11/29/2022  Medication:  estradiol valerate (DELESTROGEN) 40 MG/ML injection   Has the patient contacted their pharmacy? Yes Pharmacy advised pt the refill was denied by PCP.   Is this the correct pharmacy for this prescription? Yes If no, delete pharmacy and type the correct one.  This is the patient's preferred pharmacy:  [id number] - [place], [place] - [address] AT [facility] [address] [place] [address] Phone: [phone] Fax: [phone]     Has the prescription been filled recently? Yes  Is the patient out of the medication? Yes  Has the patient been seen for an appointment in the last year OR does the patient have an upcoming appointment? Yes  Can we respond through MyChart? No  Agent: Please be advised that Rx refills may take up to 3 business days. We ask that you follow-up with your pharmacy.

## 2023-01-26 ENCOUNTER — Telehealth: Payer: Self-pay | Admitting: Family Medicine

## 2023-01-26 ENCOUNTER — Other Ambulatory Visit: Payer: Self-pay | Admitting: Family Medicine

## 2023-01-26 NOTE — Telephone Encounter (Signed)
Medication Refill -  Most Recent Primary Care Visit:  Provider: Merita Norton T  Department: [facility]  Visit Type: NURSE VISIT  Date: 11/29/2022  Medication:  estradiol valerate (DELESTROGEN) 40 MG/ML injection  *Patient is completely out  Has the patient contacted their pharmacy? Yes, advised to contact PCP and states pharmacy will be faxing a request over.  Is this the correct pharmacy for this prescription? Yes, the one listed below.  This is the patient's preferred pharmacy:  [id number] - [place], [place] - [address] AT [facility] [address] [place] [address] [place] [address] Phone: [phone] Fax: [phone]   Has the prescription been filled recently?  Chart shows 11.12.2024 but I am not sure  Is the patient out of the medication? Yes, completely out of medication  Has the patient been seen for an appointment in the last year OR does the patient have an upcoming appointment? YES. F/U scheduled with PCP on 12.3.2024

## 2023-01-26 NOTE — Telephone Encounter (Signed)
Pt is calling to report that medication was denied at the pharmacy. Please advise with the patient

## 2023-01-26 NOTE — Telephone Encounter (Signed)
Requested medication (s) are due for refill today: routing for review  Requested medication (s) are on the active medication list: yes  Last refill:  01/23/23  Future visit scheduled: yes  Notes to clinic:  Medication not assigned to a protocol, review manually.      Requested Prescriptions  Pending Prescriptions Disp Refills   estradiol valerate (DELESTROGEN) 40 MG/ML injection 5 mL 0    Sig: USE AN INSULIN SYRINGE TO DRAW UP 13 UNITS(5MG ) UNDER THE SKIN ONCE EVERY 7 DAYS AS DIRECTED. DISCARD REMAINDER AFTER 1 MONTH.     Off-Protocol Failed - 01/26/2023  8:22 AM      Failed - Medication not assigned to a protocol, review manually.      Passed - Valid encounter within last 12 months    Recent Outpatient Visits           2 months ago Transgender   [facility] Merita Norton T, FNP   3 months ago Essential hypertension   [facility] Merita Norton T, FNP   4 months ago Breast changes, fibrocystic, right   [facility] Jacky Kindle, FNP   5 months ago Penile lesion   [facility] Jacky Kindle, FNP   6 months ago Essential hypertension   [facility] [facility] Jacky Kindle, FNP       Future Appointments             In 2 weeks Jacky Kindle, FNP [facility], [facility]   In 6 months Bacigalupo, Marzella Schlein, MD [facility], [facility]

## 2023-01-26 NOTE — Telephone Encounter (Signed)
Requested medication (s) are due for refill today - no  Requested medication (s) are on the active medication list -yes  Future visit scheduled -yes  Last refill: 01/23/23 5ml  Notes to clinic: off protocol- provider review , duplicate request  Requested Prescriptions  Pending Prescriptions Disp Refills   estradiol valerate (DELESTROGEN) 40 MG/ML injection [Pharmacy Med Name: ESTRADIOL VALERATE 40MG /ML INJ, 5ML] 5 mL 0    Sig: USE AN INSULIN SYRINGE TO DRAW UP 13 UNITS(5MG ) UNDER THE SKIN ONCE EVERY 7 DAYS AS DIRECTED. DISCARD REMAINDER AFTER 1 MONTH.     Off-Protocol Failed - 01/26/2023  8:39 AM      Failed - Medication not assigned to a protocol, review manually.      Passed - Valid encounter within last 12 months    Recent Outpatient Visits           2 months ago Transgender   [facility] Merita Norton T, FNP   3 months ago Essential hypertension   [facility] Merita Norton T, FNP   4 months ago Breast changes, fibrocystic, right   [facility] Jacky Kindle, FNP   5 months ago Penile lesion   [facility] Jacky Kindle, FNP   6 months ago Essential hypertension   [facility] Jacky Kindle, FNP       Future Appointments             In 2 weeks Jacky Kindle, FNP [facility], [facility]   In 6 months Bacigalupo, Marzella Schlein, MD [facility], [facility]               Requested Prescriptions  Pending Prescriptions Disp Refills   estradiol valerate (DELESTROGEN) 40 MG/ML injection [Pharmacy Med Name: ESTRADIOL VALERATE 40MG /ML INJ, 5ML] 5 mL 0    Sig: USE AN INSULIN SYRINGE TO DRAW UP 13 UNITS(5MG ) UNDER THE SKIN ONCE EVERY 7 DAYS AS DIRECTED. DISCARD REMAINDER AFTER 1 MONTH.     Off-Protocol Failed - 01/26/2023  8:39 AM      Failed - Medication not assigned to a protocol, review manually.       Passed - Valid encounter within last 12 months    Recent Outpatient Visits           2 months ago Transgender   [facility] Merita Norton T, FNP   3 months ago Essential hypertension   [facility] Merita Norton T, FNP   4 months ago Breast changes, fibrocystic, right   [facility] Jacky Kindle, FNP   5 months ago Penile lesion   [facility] Jacky Kindle, FNP   6 months ago Essential hypertension   [place] [facility] Jacky Kindle, FNP       Future Appointments             In 2 weeks Jacky Kindle, FNP [facility], [facility]   In 6 months Bacigalupo, Marzella Schlein, MD [facility], [facility]

## 2023-01-26 NOTE — Telephone Encounter (Signed)
   Notes to clinic: Duplicate request- off protocol- provider review- see note- should be getting fax from pharmacy also  Requested Prescriptions  Pending Prescriptions Disp Refills   estradiol valerate (DELESTROGEN) 40 MG/ML injection 5 mL 0    Sig: USE AN INSULIN SYRINGE TO DRAW UP 13 UNITS(5MG ) UNDER THE SKIN ONCE EVERY 7 DAYS AS DIRECTED. DISCARD REMAINDER AFTER 1 MONTH.     Off-Protocol Failed - 01/26/2023 11:05 AM      Failed - Medication not assigned to a protocol, review manually.      Passed - Valid encounter within last 12 months    Recent Outpatient Visits           2 months ago Transgender   [facility] Merita Norton T, FNP   3 months ago Essential hypertension   [place] [facility] Merita Norton T, FNP   4 months ago Breast changes, fibrocystic, right   [facility] Jacky Kindle, FNP   5 months ago Penile lesion   [facility] Jacky Kindle, FNP   6 months ago Essential hypertension   [place] [facility] Jacky Kindle, FNP       Future Appointments             In 2 weeks Jacky Kindle, FNP [facility], [facility]   In 6 months Bacigalupo, Marzella Schlein, MD [facility], [facility]               Requested Prescriptions  Pending Prescriptions Disp Refills   estradiol valerate (DELESTROGEN) 40 MG/ML injection 5 mL 0    Sig: USE AN INSULIN SYRINGE TO DRAW UP 13 UNITS(5MG ) UNDER THE SKIN ONCE EVERY 7 DAYS AS DIRECTED. DISCARD REMAINDER AFTER 1 MONTH.     Off-Protocol Failed - 01/26/2023 11:05 AM      Failed - Medication not assigned to a protocol, review manually.      Passed - Valid encounter within last 12 months    Recent Outpatient Visits           2 months ago Transgender   [facility] Merita Norton T, FNP   3 months ago Essential hypertension   [place] [facility] Merita Norton T, FNP   4 months ago Breast changes, fibrocystic, right   [facility] Jacky Kindle, FNP   5 months ago Penile lesion   [facility] Jacky Kindle, FNP   6 months ago Essential hypertension   [facility] Jacky Kindle, FNP       Future Appointments             In 2 weeks Jacky Kindle, FNP [facility], [facility]   In 6 months Bacigalupo, Marzella Schlein, MD [facility], [facility]

## 2023-02-07 ENCOUNTER — Other Ambulatory Visit (INDEPENDENT_AMBULATORY_CARE_PROVIDER_SITE_OTHER): Payer: Self-pay | Admitting: Pediatric Endocrinology

## 2023-02-13 ENCOUNTER — Ambulatory Visit (INDEPENDENT_AMBULATORY_CARE_PROVIDER_SITE_OTHER): Payer: Medicaid Other | Admitting: Family Medicine

## 2023-02-13 DIAGNOSIS — Z91199 Patient's noncompliance with other medical treatment and regimen due to unspecified reason: Secondary | ICD-10-CM

## 2023-02-15 ENCOUNTER — Ambulatory Visit: Payer: Medicaid Other | Admitting: Family Medicine

## 2023-02-22 ENCOUNTER — Other Ambulatory Visit (INDEPENDENT_AMBULATORY_CARE_PROVIDER_SITE_OTHER): Payer: Self-pay | Admitting: Pediatric Endocrinology

## 2023-02-22 DIAGNOSIS — E349 Endocrine disorder, unspecified: Secondary | ICD-10-CM

## 2023-02-22 NOTE — Progress Notes (Signed)
Patient was not seen for appt d/t no call, no show, or late arrival >10 mins past appt time.   Elise T Payne, FNP  [facility] [address] [place], [place] [address] [phone] (phone) [phone] (fax) [facility]  

## 2023-03-08 ENCOUNTER — Other Ambulatory Visit (INDEPENDENT_AMBULATORY_CARE_PROVIDER_SITE_OTHER): Payer: Self-pay | Admitting: Pediatric Endocrinology

## 2023-03-08 DIAGNOSIS — E349 Endocrine disorder, unspecified: Secondary | ICD-10-CM

## 2023-03-12 ENCOUNTER — Other Ambulatory Visit: Payer: Self-pay | Admitting: Family Medicine

## 2023-03-12 DIAGNOSIS — E349 Endocrine disorder, unspecified: Secondary | ICD-10-CM

## 2023-03-12 NOTE — Telephone Encounter (Signed)
Medication Refill -  Most Recent Primary Care Visit:  Provider: Merita Norton T  Department: [facility]  Visit Type: NURSE VISIT  Date: 11/29/2022  Medication: spironolactone (ALDACTONE) 50 MG tablet [[id number]]   Insulin Syringe-Needle U-100 (INSULIN SYRINGE .5CC/28G) 28G X 1/2" 0.5 ML MISC [[id number]]     Has the patient contacted their pharmacy? Yes (Agent: If no, request that the patient contact the pharmacy for the refill. If patient does not wish to contact the pharmacy document the reason why and proceed with request.) (Agent: If yes, when and what did the pharmacy advise?)  Is this the correct pharmacy for this prescription? Yes If no, delete pharmacy and type the correct one.  This is the patient's preferred pharmacy:  [id number] - [place], [place] - [address] AT [facility] [address] [place] [address] [place] [address] Phone: [phone] Fax: [phone]    Has the prescription been filled recently? Yes  Is the patient out of the medication? Yes Patient has been out for 2-3 days  Has the patient been seen for an appointment in the last year OR does the patient have an upcoming appointment? Yes  Can we respond through MyChart? No  Agent: Please be advised that Rx refills may take up to 3 business days. We ask that you follow-up with your pharmacy.

## 2023-03-15 ENCOUNTER — Telehealth: Payer: Self-pay | Admitting: Family Medicine

## 2023-03-15 ENCOUNTER — Other Ambulatory Visit (INDEPENDENT_AMBULATORY_CARE_PROVIDER_SITE_OTHER): Payer: Self-pay | Admitting: Pediatric Endocrinology

## 2023-03-15 NOTE — Telephone Encounter (Signed)
 Medication Refill -  Most Recent Primary Care Visit:  Provider: PAYNE, ELISE T  Department: [facility]  Visit Type: NURSE VISIT  Date: 11/29/2022  Medication: Insulin  Syringe-Needle U-100 (INSULIN  SYRINGE .5CC/28G) 28G X 1/2 0.5 ML MISC   Has the patient contacted their pharmacy? yes (Agent: If yes, when and what did the pharmacy advise?)contact pcp/ PREVIOUS prescribing Dr , endocrinologist has switched states so pt is not with that Dr anymore, is why requesting this from Dr at [facility]  Is this the correct pharmacy for this prescription? yes If no, delete pharmacy and type the correct one.  This is the patient's preferred pharmacy:  [id number] - [place], [place] - [address] AT [facility] OF [address] [facility] [address] [place] [address] Phone: [phone] Fax: [phone]    Has the prescription been filled recently? no  Is the patient out of the medication? yes Has the patient been seen for an appointment in the last year OR does the patient have an upcoming appointment? yes  Can we respond through MyChart? yes  Agent: Please be advised that Rx refills may take up to 3 business days. We ask that you follow-up with your pharmacy.

## 2023-03-16 MED ORDER — INSULIN SYRINGE 28G X 1/2" 0.5 ML MISC: 0 refills | Status: DC

## 2023-03-16 MED ORDER — SPIRONOLACTONE 50 MG PO TABS: 50.0000 mg | ORAL_TABLET | Freq: Every day | ORAL | 0 refills | Status: DC

## 2023-03-16 NOTE — Telephone Encounter (Signed)
 Already requested on 03/12/23 in a separate refill encounter, this is the 2nd request.

## 2023-03-16 NOTE — Telephone Encounter (Signed)
 Requested Prescriptions  Pending Prescriptions Disp Refills   spironolactone  (ALDACTONE ) 50 MG tablet 90 tablet 0    Sig: Take 1 tablet (50 mg total) by mouth daily.     Cardiovascular: Diuretics - Aldosterone Antagonist Failed - 03/16/2023 11:15 AM      Failed - Cr in normal range and within 180 days    Creat  Date Value Ref Range Status  06/26/2022 0.71 0.60 - 1.24 mg/dL Final         Failed - K in normal range and within 180 days    Potassium  Date Value Ref Range Status  06/26/2022 3.8 3.8 - 5.1 mmol/L Final         Failed - Na in normal range and within 180 days    Sodium  Date Value Ref Range Status  06/26/2022 136 135 - 146 mmol/L Final  12/22/2021 137 134 - 144 mmol/L Final         Failed - eGFR is 30 or above and within 180 days    GFR, Estimated  Date Value Ref Range Status  05/08/2022 >60 >60 mL/min Final    Comment:    (NOTE) Calculated using the CKD-EPI Creatinine Equation (2021)    eGFR  Date Value Ref Range Status  02/17/2021 141 > OR = 60 mL/min/1.41m2 Final    Comment:    The eGFR is based on the CKD-EPI 2021 equation. To calculate  the new eGFR from a previous Creatinine or Cystatin C result, go to https://www.kidney.org/professionals/ kdoqi/gfr%5Fcalculator          Passed - Last BP in normal range    BP Readings from Last 1 Encounters:  11/24/22 134/88         Passed - Valid encounter within last 6 months    Recent Outpatient Visits           1 month ago No-show for appointment   [facility] Emilio Kelly DASEN, FNP   3 months ago Transgender   [facility] Emilio Kelly T, FNP   5 months ago Essential hypertension   [place] [facility] Emilio Kelly T, FNP   5 months ago Breast changes, fibrocystic, right   [facility] Emilio Kelly DASEN, FNP   7 months ago Penile lesion   [facility] Emilio Kelly DASEN, FNP       Future  Appointments             In 6 days Simmons-Robinson, Rockie, MD [facility], [facility]   In 4 months Bacigalupo, Jon HERO, MD [facility], [facility]             Insulin  Syringe-Needle U-100 (INSULIN  SYRINGE .5CC/28G) 28G X 1/2 0.5 ML MISC 10 each 0    Sig: Use as directed with estradiol      There is no refill protocol information for this order

## 2023-03-22 ENCOUNTER — Ambulatory Visit: Payer: Medicaid Other | Admitting: Family Medicine

## 2023-04-09 ENCOUNTER — Ambulatory Visit: Payer: Medicaid Other | Admitting: Family Medicine

## 2023-05-09 ENCOUNTER — Other Ambulatory Visit (INDEPENDENT_AMBULATORY_CARE_PROVIDER_SITE_OTHER): Payer: Self-pay | Admitting: Pediatric Endocrinology

## 2023-05-09 DIAGNOSIS — I10 Essential (primary) hypertension: Secondary | ICD-10-CM

## 2023-06-06 ENCOUNTER — Other Ambulatory Visit (INDEPENDENT_AMBULATORY_CARE_PROVIDER_SITE_OTHER): Payer: Self-pay | Admitting: Pediatric Endocrinology

## 2023-06-06 DIAGNOSIS — I10 Essential (primary) hypertension: Secondary | ICD-10-CM

## 2023-06-19 ENCOUNTER — Telehealth: Payer: Self-pay | Admitting: Family Medicine

## 2023-06-19 NOTE — Telephone Encounter (Signed)
 [facility] pharmacy is requesting refill estradiol (ESTRACE) 2 MG tablet   Please advise

## 2023-07-02 ENCOUNTER — Ambulatory Visit (INDEPENDENT_AMBULATORY_CARE_PROVIDER_SITE_OTHER): Admitting: Family Medicine

## 2023-07-02 ENCOUNTER — Encounter: Payer: Self-pay | Admitting: Family Medicine

## 2023-07-02 VITALS — BP 134/93 | HR 91 | Resp 16 | Ht 71.0 in | Wt 169.7 lb

## 2023-07-02 DIAGNOSIS — E349 Endocrine disorder, unspecified: Secondary | ICD-10-CM

## 2023-07-02 DIAGNOSIS — Z79818 Long term (current) use of other agents affecting estrogen receptors and estrogen levels: Secondary | ICD-10-CM | POA: Diagnosis not present

## 2023-07-02 DIAGNOSIS — Z789 Other specified health status: Secondary | ICD-10-CM

## 2023-07-02 DIAGNOSIS — B009 Herpesviral infection, unspecified: Secondary | ICD-10-CM

## 2023-07-02 DIAGNOSIS — I10 Essential (primary) hypertension: Secondary | ICD-10-CM

## 2023-07-02 DIAGNOSIS — F649 Gender identity disorder, unspecified: Secondary | ICD-10-CM

## 2023-07-02 DIAGNOSIS — Z23 Encounter for immunization: Secondary | ICD-10-CM | POA: Diagnosis not present

## 2023-07-02 MED ORDER — SPIRONOLACTONE 50 MG PO TABS: 50.0000 mg | ORAL_TABLET | Freq: Every day | ORAL | 0 refills | Status: DC

## 2023-07-02 MED ORDER — VALACYCLOVIR HCL 500 MG PO TABS: 500.0000 mg | ORAL_TABLET | Freq: Two times a day (BID) | ORAL | 3 refills | Status: AC

## 2023-07-02 MED ORDER — INSULIN SYRINGE 28G X 1/2" 0.5 ML MISC: 0 refills | Status: DC

## 2023-07-02 MED ORDER — MEDROXYPROGESTERONE ACETATE 150 MG/ML IM SUSP: 150.0000 mg | INTRAMUSCULAR | 1 refills | Status: AC

## 2023-07-02 MED ORDER — LISINOPRIL-HYDROCHLOROTHIAZIDE 10-12.5 MG PO TABS: 1.0000 | ORAL_TABLET | Freq: Every day | ORAL | 3 refills | Status: DC

## 2023-07-02 NOTE — Progress Notes (Signed)
 Established patient visit   Patient: Matthew Parrish   DOB: [birth year]   23 y.o. Adult  MRN: [id number] Visit Date: 07/02/2023  Today's healthcare provider: Carlean Charter, DO   Chief Complaint  Patient presents with   Medical Management of Chronic Issues    Hormone check   Subjective    HPI Matthew Parrish is a 23 year old transgender male who presents for hormone level checks and medication management.  She is currently on hormone therapy, including Depo-Provera , spironolactone , and delestrogen  injections. Depo-Provera  is administered every three months, spironolactone  is taken daily, and delestrogen  is injected weekly. She has not had a Depo-Provera  injection since last year but has enough supply of delestrogen  for now.  She is experiencing difficulty refilling her prescriptions for spironolactone  and lisinopril -hydrochlorothiazide , having run out of it about a week ago. She also takes valacyclovir  daily and is starting to run low, expecting to need a refill in about a month.  She does not take estradiol  tablets, selenium, or vitamin D anymore. She occasionally takes zinc and uses doxycycline only during sexual interactions. She receives Descovy for PrEP every three months through a free online service.  She has no history of low blood sugar and does not use glucagon. She has not had the meningococcal B vaccine but is open to receiving it. She lives in a communal setting, which may increase her risk for meningococcal disease.      Medications: Outpatient Medications Prior to Visit  Medication Sig   DESCOVY 200-25 MG tablet Take 1 tablet by mouth daily.   doxycycline (VIBRAMYCIN) 100 MG capsule Take 100 mg by mouth 2 (two) times daily.   estradiol  valerate (DELESTROGEN ) 40 MG/ML injection USE AN INSULIN  SYRINGE TO DRAW UP 13 UNITS(5MG ) UNDER THE SKIN ONCE EVERY 7 DAYS AS DIRECTED. DISCARD REMAINDER AFTER 1 MONTH.   glucagon (human recombinant) in  dextrose  5 % 50 mL Inject into the vein once.   zinc gluconate 50 MG tablet Take 50 mg by mouth daily.   [DISCONTINUED] Cholecalciferol (VITAMIN D) 125 MCG (5000 UT) CAPS Take by mouth.   [DISCONTINUED] estradiol  (ESTRACE ) 2 MG tablet Take 2 mg by mouth daily.   [DISCONTINUED] Insulin  Syringe-Needle U-100 (INSULIN  SYRINGE .5CC/28G) 28G X 1/2" 0.5 ML MISC Use as directed with estradiol    [DISCONTINUED] lisinopril -hydrochlorothiazide  (ZESTORETIC ) 10-12.5 MG tablet Take 1 tablet by mouth daily.   [DISCONTINUED] medroxyPROGESTERone  (DEPO-PROVERA ) 150 MG/ML injection Inject 1 mL (150 mg total) into the muscle every 3 (three) months.   [DISCONTINUED] spironolactone  (ALDACTONE ) 50 MG tablet Take 1 tablet (50 mg total) by mouth daily.   [DISCONTINUED] valACYclovir  (VALTREX ) 500 MG tablet Take 1 tablet (500 mg total) by mouth 2 (two) times daily.   [DISCONTINUED] diazepam  (VALIUM ) 5 MG tablet Take 1 tablet (5 mg total) by mouth every 6 (six) hours as needed for anxiety.   [DISCONTINUED] selenium 50 MCG TABS tablet Take 50 mcg by mouth daily.   No facility-administered medications prior to visit.    Review of Systems  Eyes:  Negative for visual disturbance.  Respiratory:  Negative for shortness of breath.   Cardiovascular:  Negative for chest pain and palpitations.  Gastrointestinal:  Negative for abdominal pain, nausea and vomiting.  Neurological:  Negative for dizziness, weakness and headaches.        Objective    BP (!) 134/93 (BP Location: Right Arm, Patient Position: Sitting, Cuff Size: Normal)   Pulse 91   Resp 16   Ht  5\' 11"  (1.803 m)   Wt 169 lb 11.2 oz (77 kg)   SpO2 99%   BMI 23.67 kg/m     Physical Exam Vitals reviewed.  Constitutional:      General: She is not in acute distress.    Appearance: Normal appearance. She is not diaphoretic.  HENT:     Head: Normocephalic and atraumatic.  Eyes:     General: No scleral icterus.    Conjunctiva/sclera: Conjunctivae normal.   Cardiovascular:     Rate and Rhythm: Normal rate and regular rhythm.     Pulses: Normal pulses.     Heart sounds: Normal heart sounds. No murmur heard. Pulmonary:     Effort: Pulmonary effort is normal. No respiratory distress.     Breath sounds: Normal breath sounds. No wheezing or rhonchi.  Abdominal:     General: Bowel sounds are normal.     Palpations: Abdomen is soft.  Musculoskeletal:     Cervical back: Neck supple.     Right lower leg: No edema.     Left lower leg: No edema.  Lymphadenopathy:     Cervical: No cervical adenopathy.  Skin:    General: Skin is warm and dry.     Findings: No rash.  Neurological:     Mental Status: She is alert and oriented to person, place, and time. Mental status is at baseline.  Psychiatric:        Mood and Affect: Mood normal.        Behavior: Behavior normal.      No results found for any visits on 07/02/23.  Assessment & Plan    Transgender -     medroxyPROGESTERone  Acetate; Inject 1 mL (150 mg total) into the muscle every 3 (three) months.  Dispense: 1 mL; Refill: 1  Endocrine disorder related to puberty -     medroxyPROGESTERone  Acetate; Inject 1 mL (150 mg total) into the muscle every 3 (three) months.  Dispense: 1 mL; Refill: 1 -     Spironolactone ; Take 1 tablet (50 mg total) by mouth daily.  Dispense: 90 tablet; Refill: 0 -     TestT+TestF+SHBG -     CBC with Differential/Platelet -     Estradiol , Ultra Sens -     FSH/LH -     Prolactin -     Ambulatory referral to Endocrinology  Gender dysphoria -     Ambulatory referral to Endocrinology  Current use of estrogen therapy -     Insulin  Syringe; Use as directed with estradiol   Dispense: 10 each; Refill: 0 -     TestT+TestF+SHBG -     CBC with Differential/Platelet -     Estradiol , Ultra Sens -     FSH/LH -     Prolactin -     Ambulatory referral to Endocrinology  Meningococcal group B vaccine administered -     Meningococcal B, OMV  Essential hypertension -      Lisinopril -hydroCHLOROthiazide ; Take 1 tablet by mouth daily.  Dispense: 90 tablet; Refill: 3 -     Comprehensive metabolic panel with GFR -     Lipid panel  HSV-1 (herpes simplex virus 1) infection -     valACYclovir  HCl; Take 1 tablet (500 mg total) by mouth 2 (two) times daily.  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 Prefers local endocrinologist for ongoing management. - Refer to [facility] clinic  for endocrinology if covered by insurance, otherwise refer to [facility] endocrinology in [place].  Hormone Therapy Management On Depo-Provera , spironolactone , and delestrogen . Difficulty refilling prescriptions due to provider change. Open to continuing regimen and understands need for monitoring. - Send prescriptions for Depo-Provera , spironolactone , and Valacyclovir  to pharmacy. - Order blood work: CBC, cholesterol panel, metabolic panel, hormone levels. - Advise to pick up Depo-Provera  and return for injection.  Essential hypertension Hypertension with recent lapse in lisinopril -hydrochlorothiazide . No symptoms reported. Understands importance of regimen. - Send prescription for lisinopril -hydrochlorothiazide  to pharmacy.  PrEP Management On PrEP (Descovy) through [employer]. Uses doxycycline for additional protection but not regularly. Understands importance of adherence.  Herpes Simplex Virus Management Takes Valacyclovir  daily. Running low on supply. Understands need to maintain medication for outbreak management. - Send prescription for Valacyclovir  to pharmacy.  General Health Maintenance Due for meningococcal B vaccine. Informed about COVID booster availability at pharmacy. - Administer meningococcal B vaccine. - Inform about COVID booster availability at pharmacy.   Return in about 3 months (around 10/01/2023) for chronic/recheck.      I  discussed the assessment and treatment plan with the patient  The patient was provided an opportunity to ask questions and all were answered. The patient agreed with the plan and demonstrated an understanding of the instructions.   The patient was advised to call back or seek an in-person evaluation if the symptoms worsen or if the condition fails to improve as anticipated.    Carlean Charter, DO  [facility] [phone] (phone) [phone] (fax)  [facility]

## 2023-07-09 LAB — CBC WITH DIFFERENTIAL/PLATELET
Basophils Absolute: 0 10*3/uL (ref 0.0–0.2)
Basos: 1 %
EOS (ABSOLUTE): 0 10*3/uL (ref 0.0–0.4)
Eos: 1 %
Hematocrit: 40.8 % (ref 37.5–51.0)
Hemoglobin: 13.1 g/dL (ref 13.0–17.7)
Immature Grans (Abs): 0 10*3/uL (ref 0.0–0.1)
Immature Granulocytes: 0 %
Lymphocytes Absolute: 2.3 10*3/uL (ref 0.7–3.1)
Lymphs: 33 %
MCH: 27.7 pg (ref 26.6–33.0)
MCHC: 32.1 g/dL (ref 31.5–35.7)
MCV: 86 fL (ref 79–97)
Monocytes Absolute: 0.5 10*3/uL (ref 0.1–0.9)
Monocytes: 7 %
Neutrophils Absolute: 4.2 10*3/uL (ref 1.4–7.0)
Neutrophils: 58 %
Platelets: 316 10*3/uL (ref 150–450)
RBC: 4.73 x10E6/uL (ref 4.14–5.80)
RDW: 12.1 % (ref 11.6–15.4)
WBC: 7.2 10*3/uL (ref 3.4–10.8)

## 2023-07-09 LAB — COMPREHENSIVE METABOLIC PANEL WITH GFR
ALT: 17 IU/L (ref 0–44)
AST: 17 IU/L (ref 0–40)
Albumin: 4.2 g/dL — ABNORMAL LOW (ref 4.3–5.2)
Alkaline Phosphatase: 72 IU/L (ref 51–125)
BUN/Creatinine Ratio: 16 (ref 9–20)
BUN: 15 mg/dL (ref 6–20)
Bilirubin Total: 0.2 mg/dL (ref 0.0–1.2)
CO2: 21 mmol/L (ref 20–29)
Calcium: 9.5 mg/dL (ref 8.7–10.2)
Chloride: 106 mmol/L (ref 96–106)
Creatinine, Ser: 0.91 mg/dL (ref 0.76–1.27)
Globulin, Total: 3.2 g/dL (ref 1.5–4.5)
Glucose: 84 mg/dL (ref 70–99)
Potassium: 5.1 mmol/L (ref 3.5–5.2)
Sodium: 140 mmol/L (ref 134–144)
Total Protein: 7.4 g/dL (ref 6.0–8.5)
eGFR: 124 mL/min/{1.73_m2} (ref >59)

## 2023-07-09 LAB — LIPID PANEL
Chol/HDL Ratio: 3.5 ratio (ref 0.0–5.0)
Cholesterol, Total: 146 mg/dL (ref 100–199)
HDL: 42 mg/dL (ref >39)
LDL Chol Calc (NIH): 88 mg/dL (ref 0–99)
Triglycerides: 80 mg/dL (ref 0–149)
VLDL Cholesterol Cal: 16 mg/dL (ref 5–40)

## 2023-07-09 LAB — ESTRADIOL, ULTRA SENS: Estradiol, Sensitive: 81.8 pg/mL — ABNORMAL HIGH (ref 8.0–35.0)

## 2023-07-09 LAB — TESTT+TESTF+SHBG
Sex Hormone Binding: 58.1 nmol/L — ABNORMAL HIGH (ref 16.5–55.9)
Testosterone, Free: 0.4 pg/mL — ABNORMAL LOW (ref 9.3–26.5)
Testosterone, Total, LC/MS: 19 ng/dL — ABNORMAL LOW (ref 264.0–916.0)

## 2023-07-09 LAB — FSH/LH
FSH: 1.1 m[IU]/mL — ABNORMAL LOW (ref 1.5–12.4)
LH: 2 m[IU]/mL (ref 1.7–8.6)

## 2023-07-09 LAB — PROLACTIN: Prolactin: 11.3 ng/mL (ref 3.6–31.5)

## 2023-07-23 ENCOUNTER — Encounter (HOSPITAL_COMMUNITY): Payer: Self-pay

## 2023-07-30 ENCOUNTER — Encounter: Payer: Self-pay | Admitting: Family Medicine

## 2023-07-30 ENCOUNTER — Ambulatory Visit: Admitting: Family Medicine

## 2023-07-31 ENCOUNTER — Ambulatory Visit: Payer: Self-pay | Admitting: Family Medicine

## 2023-09-10 ENCOUNTER — Other Ambulatory Visit: Payer: Self-pay | Admitting: Family Medicine

## 2023-09-10 DIAGNOSIS — Z79818 Long term (current) use of other agents affecting estrogen receptors and estrogen levels: Secondary | ICD-10-CM

## 2023-10-03 ENCOUNTER — Other Ambulatory Visit: Payer: Self-pay | Admitting: Family Medicine

## 2023-10-03 DIAGNOSIS — E349 Endocrine disorder, unspecified: Secondary | ICD-10-CM

## 2023-10-03 DIAGNOSIS — F649 Gender identity disorder, unspecified: Secondary | ICD-10-CM

## 2023-10-03 DIAGNOSIS — I10 Essential (primary) hypertension: Secondary | ICD-10-CM

## 2023-10-03 DIAGNOSIS — Z789 Other specified health status: Secondary | ICD-10-CM

## 2023-10-03 NOTE — Telephone Encounter (Signed)
 Copied from CRM [id number]. Topic: Clinical - Medication Refill >> Oct 03, 2023 10:58 AM Sophia H wrote: Medication: estradiol  valerate (DELESTROGEN ) 40 MG/ML injection spironolactone  (ALDACTONE ) 50 MG tablet lisinopril -hydrochlorothiazide  (ZESTORETIC ) 10-12.5 MG tablet  Has the patient contacted their pharmacy? Yes, told to contact officce   This is the patient's preferred pharmacy:  [facility] #[id number] [place], [place] - [address] AT [facility] OF [address] [place] [address] [place] [address] Phone: [phone] Fax: [phone]   Is this the correct pharmacy for this prescription? Yes If no, delete pharmacy and type the correct one.   Has the prescription been filled recently? Yes  Is the patient out of the medication? No, has about a week left   Has the patient been seen for an appointment in the last year OR does the patient have an upcoming appointment? Yes, appt scheduled for August with PCP   Can we respond through MyChart? Yes  Agent: Please be advised that Rx refills may take up to 3 business days. We ask that you follow-up with your pharmacy.

## 2023-10-05 MED ORDER — ESTRADIOL VALERATE 40 MG/ML IM OIL: TOPICAL_OIL | INTRAMUSCULAR | 0 refills | Status: DC

## 2023-10-05 MED ORDER — SPIRONOLACTONE 50 MG PO TABS: 50.0000 mg | ORAL_TABLET | Freq: Every day | ORAL | 0 refills | Status: AC

## 2023-10-05 MED ORDER — LISINOPRIL-HYDROCHLOROTHIAZIDE 10-12.5 MG PO TABS: 1.0000 | ORAL_TABLET | Freq: Every day | ORAL | 3 refills | Status: AC

## 2023-10-05 NOTE — Telephone Encounter (Signed)
 Too soon for Zestoretic  refill.  Requested Prescriptions  Pending Prescriptions Disp Refills   estradiol  valerate (DELESTROGEN ) 40 MG/ML injection 5 mL 0    Sig: USE AN INSULIN  SYRINGE TO DRAW UP 13 UNITS(5MG ) UNDER THE SKIN ONCE EVERY 7 DAYS AS DIRECTED. DISCARD REMAINDER AFTER 1 MONTH.     Off-Protocol Failed - 10/05/2023  9:09 AM      Failed - Medication not assigned to a protocol, review manually.      Passed - Valid encounter within last 12 months    Recent Outpatient Visits           3 months ago Transgender   [facility] [place], Sarah N, DO               spironolactone  (ALDACTONE ) 50 MG tablet 90 tablet 0    Sig: Take 1 tablet (50 mg total) by mouth daily.     Cardiovascular: Diuretics - Aldosterone Antagonist Failed - 10/05/2023  9:09 AM      Failed - Last BP in normal range    BP Readings from Last 1 Encounters:  07/02/23 (!) 134/93         Passed - Cr in normal range and within 180 days    Creat  Date Value Ref Range Status  06/26/2022 0.71 0.60 - 1.24 mg/dL Final   Creatinine, Ser  Date Value Ref Range Status  07/02/2023 0.91 0.76 - 1.27 mg/dL Final         Passed - K in normal range and within 180 days    Potassium  Date Value Ref Range Status  07/02/2023 5.1 3.5 - 5.2 mmol/L Final         Passed - Na in normal range and within 180 days    Sodium  Date Value Ref Range Status  07/02/2023 140 134 - 144 mmol/L Final         Passed - eGFR is 30 or above and within 180 days    GFR, Estimated  Date Value Ref Range Status  05/08/2022 >60 >60 mL/min Final    Comment:    (NOTE) Calculated using the CKD-EPI Creatinine Equation (2021)    eGFR  Date Value Ref Range Status  07/02/2023 124 >59 mL/min/1.73 Final         Passed - Valid encounter within last 6 months    Recent Outpatient Visits           3 months ago Transgender   [facility] [place], Sarah N, DO                lisinopril -hydrochlorothiazide  (ZESTORETIC ) 10-12.5 MG tablet 90 tablet 3    Sig: Take 1 tablet by mouth daily.     Cardiovascular:  ACEI + Diuretic Combos Failed - 10/05/2023  9:09 AM      Failed - Last BP in normal range    BP Readings from Last 1 Encounters:  07/02/23 (!) 134/93         Passed - Na in normal range and within 180 days    Sodium  Date Value Ref Range Status  07/02/2023 140 134 - 144 mmol/L Final         Passed - K in normal range and within 180 days    Potassium  Date Value Ref Range Status  07/02/2023 5.1 3.5 - 5.2 mmol/L Final         Passed - Cr in normal range and within 180 days  Creat  Date Value Ref Range Status  06/26/2022 0.71 0.60 - 1.24 mg/dL Final   Creatinine, Ser  Date Value Ref Range Status  07/02/2023 0.91 0.76 - 1.27 mg/dL Final         Passed - eGFR is 30 or above and within 180 days    GFR, Estimated  Date Value Ref Range Status  05/08/2022 >60 >60 mL/min Final    Comment:    (NOTE) Calculated using the CKD-EPI Creatinine Equation (2021)    eGFR  Date Value Ref Range Status  07/02/2023 124 >59 mL/min/1.73 Final         Passed - Patient is not pregnant      Passed - Valid encounter within last 6 months    Recent Outpatient Visits           3 months ago Transgender   [facility] [place], Lauraine SAILOR, DO

## 2023-10-05 NOTE — Telephone Encounter (Signed)
 Requested medication (s) are due for refill today: yes  Requested medication (s) are on the active medication list: yes  Last refill:  01/23/23  Future visit scheduled: yes  Notes to clinic:   Medication not assigned to a protocol, review manually.      Requested Prescriptions  Pending Prescriptions Disp Refills   estradiol  valerate (DELESTROGEN ) 40 MG/ML injection 5 mL 0    Sig: USE AN INSULIN  SYRINGE TO DRAW UP 13 UNITS(5MG ) UNDER THE SKIN ONCE EVERY 7 DAYS AS DIRECTED. DISCARD REMAINDER AFTER 1 MONTH.     Off-Protocol Failed - 10/05/2023  9:10 AM      Failed - Medication not assigned to a protocol, review manually.      Passed - Valid encounter within last 12 months    Recent Outpatient Visits           3 months ago Transgender   [facility] [place], Lauraine SAILOR, DO              Signed Prescriptions Disp Refills   spironolactone  (ALDACTONE ) 50 MG tablet 90 tablet 0    Sig: Take 1 tablet (50 mg total) by mouth daily.     Cardiovascular: Diuretics - Aldosterone Antagonist Failed - 10/05/2023  9:10 AM      Failed - Last BP in normal range    BP Readings from Last 1 Encounters:  07/02/23 (!) 134/93         Passed - Cr in normal range and within 180 days    Creat  Date Value Ref Range Status  06/26/2022 0.71 0.60 - 1.24 mg/dL Final   Creatinine, Ser  Date Value Ref Range Status  07/02/2023 0.91 0.76 - 1.27 mg/dL Final         Passed - K in normal range and within 180 days    Potassium  Date Value Ref Range Status  07/02/2023 5.1 3.5 - 5.2 mmol/L Final         Passed - Na in normal range and within 180 days    Sodium  Date Value Ref Range Status  07/02/2023 140 134 - 144 mmol/L Final         Passed - eGFR is 30 or above and within 180 days    GFR, Estimated  Date Value Ref Range Status  05/08/2022 >60 >60 mL/min Final    Comment:    (NOTE) Calculated using the CKD-EPI Creatinine Equation (2021)    eGFR  Date Value Ref Range  Status  07/02/2023 124 >59 mL/min/1.73 Final         Passed - Valid encounter within last 6 months    Recent Outpatient Visits           3 months ago Transgender   [facility] [place], Sarah N, DO              Refused Prescriptions Disp Refills   lisinopril -hydrochlorothiazide  (ZESTORETIC ) 10-12.5 MG tablet 90 tablet 3    Sig: Take 1 tablet by mouth daily.     Cardiovascular:  ACEI + Diuretic Combos Failed - 10/05/2023  9:10 AM      Failed - Last BP in normal range    BP Readings from Last 1 Encounters:  07/02/23 (!) 134/93         Passed - Na in normal range and within 180 days    Sodium  Date Value Ref Range Status  07/02/2023 140 134 - 144 mmol/L Final  Passed - K in normal range and within 180 days    Potassium  Date Value Ref Range Status  07/02/2023 5.1 3.5 - 5.2 mmol/L Final         Passed - Cr in normal range and within 180 days    Creat  Date Value Ref Range Status  06/26/2022 0.71 0.60 - 1.24 mg/dL Final   Creatinine, Ser  Date Value Ref Range Status  07/02/2023 0.91 0.76 - 1.27 mg/dL Final         Passed - eGFR is 30 or above and within 180 days    GFR, Estimated  Date Value Ref Range Status  05/08/2022 >60 >60 mL/min Final    Comment:    (NOTE) Calculated using the CKD-EPI Creatinine Equation (2021)    eGFR  Date Value Ref Range Status  07/02/2023 124 >59 mL/min/1.73 Final         Passed - Patient is not pregnant      Passed - Valid encounter within last 6 months    Recent Outpatient Visits           3 months ago Transgender   [facility] [place], Lauraine SAILOR, DO

## 2023-11-07 ENCOUNTER — Encounter: Admitting: Family Medicine

## 2023-11-26 ENCOUNTER — Telehealth: Payer: Self-pay | Admitting: Family Medicine

## 2023-11-26 DIAGNOSIS — F649 Gender identity disorder, unspecified: Secondary | ICD-10-CM

## 2023-11-26 DIAGNOSIS — Z79818 Long term (current) use of other agents affecting estrogen receptors and estrogen levels: Secondary | ICD-10-CM

## 2023-11-26 DIAGNOSIS — Z789 Other specified health status: Secondary | ICD-10-CM

## 2023-11-26 NOTE — Telephone Encounter (Signed)
 [facility] is asking for refills on the [facility] Syr/NDL 29G 0.5ML U/F #10 with refills

## 2023-11-27 ENCOUNTER — Other Ambulatory Visit: Payer: Self-pay

## 2023-11-27 MED ORDER — INSULIN SYRINGE 29G X 1/2" 0.5 ML MISC: 1.0000 | 3 refills | Status: AC

## 2023-11-27 NOTE — Addendum Note (Signed)
 Addended byBETHA DONZELLA DOMINO on: 11/27/2023 12:06 PM   Modules accepted: Orders

## 2023-12-04 ENCOUNTER — Encounter: Admitting: Family Medicine

## 2024-02-11 ENCOUNTER — Other Ambulatory Visit: Payer: Self-pay | Admitting: Family Medicine

## 2024-02-11 DIAGNOSIS — F649 Gender identity disorder, unspecified: Secondary | ICD-10-CM

## 2024-02-11 DIAGNOSIS — Z789 Other specified health status: Secondary | ICD-10-CM

## 2024-02-13 NOTE — Telephone Encounter (Signed)
 Please review in provider's absence.  Thank you.   LOV- 07/02/2023 NOV- None [facility]- 10/05/2023 Outpatient Medication Detail   Disp Refills Start End   estradiol  valerate (DELESTROGEN ) 40 MG/ML injection 5 mL 0 10/05/2023 --   Sig: USE AN INSULIN  SYRINGE TO DRAW UP 13 UNITS(5MG ) UNDER THE SKIN ONCE EVERY 7 DAYS AS DIRECTED. DISCARD REMAINDER AFTER 1 MONTH.   Sent to pharmacy as: estradiol  valerate (DELESTROGEN ) 40 MG/ML injection   E-Prescribing Status: Receipt confirmed by pharmacy (10/05/2023  3:47 PM EDT)
